# Patient Record
Sex: Female | Born: 1960 | Race: White | Hispanic: No | Marital: Married | Smoking: Never smoker
Health system: Southern US, Community
[De-identification: ages and names within clinical notes are randomized; demographics above are authoritative.]

## PROBLEM LIST (undated history)

## (undated) HISTORY — PX: BUNIONECTOMY: SHX129

---

## 2014-01-11 LAB — HM COLONOSCOPY

## 2017-12-10 ENCOUNTER — Encounter: Payer: Self-pay | Admitting: Family Medicine

## 2017-12-10 ENCOUNTER — Ambulatory Visit: Payer: Federal, State, Local not specified - PPO | Admitting: Family Medicine

## 2017-12-10 ENCOUNTER — Encounter (INDEPENDENT_AMBULATORY_CARE_PROVIDER_SITE_OTHER): Payer: Self-pay

## 2017-12-10 ENCOUNTER — Other Ambulatory Visit (HOSPITAL_COMMUNITY)
Admission: RE | Admit: 2017-12-10 | Discharge: 2017-12-10 | Disposition: A | Discharge status: Home / Self Care | Payer: Federal, State, Local not specified - PPO | Source: Ambulatory Visit | Attending: Family Medicine | Admitting: Family Medicine

## 2017-12-10 VITALS — BP 120/70 | Resp 14 | Ht 63.0 in | Wt 172.8 lb

## 2017-12-10 DIAGNOSIS — Z1231 Encounter for screening mammogram for malignant neoplasm of breast: Secondary | ICD-10-CM | POA: Diagnosis not present

## 2017-12-10 DIAGNOSIS — N3941 Urge incontinence: Secondary | ICD-10-CM | POA: Diagnosis not present

## 2017-12-10 DIAGNOSIS — Z0000 Encounter for general adult medical examination without abnormal findings: Secondary | ICD-10-CM | POA: Diagnosis not present

## 2017-12-10 DIAGNOSIS — Z7689 Persons encountering health services in other specified circumstances: Secondary | ICD-10-CM

## 2017-12-10 DIAGNOSIS — Z23 Encounter for immunization: Secondary | ICD-10-CM

## 2017-12-10 DIAGNOSIS — Z13820 Encounter for screening for osteoporosis: Secondary | ICD-10-CM

## 2017-12-10 DIAGNOSIS — M1711 Unilateral primary osteoarthritis, right knee: Secondary | ICD-10-CM

## 2017-12-10 DIAGNOSIS — E66811 Obesity, class 1: Secondary | ICD-10-CM

## 2017-12-10 DIAGNOSIS — Z6830 Body mass index (BMI) 30.0-30.9, adult: Secondary | ICD-10-CM

## 2017-12-10 DIAGNOSIS — Z1159 Encounter for screening for other viral diseases: Secondary | ICD-10-CM | POA: Diagnosis not present

## 2017-12-10 DIAGNOSIS — E6609 Other obesity due to excess calories: Secondary | ICD-10-CM | POA: Diagnosis not present

## 2017-12-10 DIAGNOSIS — Z124 Encounter for screening for malignant neoplasm of cervix: Secondary | ICD-10-CM

## 2017-12-10 DIAGNOSIS — R21 Rash and other nonspecific skin eruption: Secondary | ICD-10-CM | POA: Diagnosis not present

## 2017-12-10 DIAGNOSIS — Z13220 Encounter for screening for lipoid disorders: Secondary | ICD-10-CM

## 2017-12-10 NOTE — Progress Notes (Signed)
Name: Carrie Jackson   MRN: [id number]    DOB: May 11, 1960   Date:12/10/2017       Progress Note  Subjective  Chief Complaint  Chief Complaint  Patient presents with  . Establish Care  . Rash    bilateral hands recurrent blisters since July    HPI  Patient presents for annual CPE, bilateral hand rash, and establish care.  She moved to [place] from [place] about a year ago with her husband and is in need of a PCP.  Hand rash - blistering rash to dorsal side of hands since July; she notices it worsens with heat exposure.  She was given triamcinolone cream by provider in [place] and this did not seem to help.  She takes estroven and noticed that they changed the formulation/look of the pill around the same time that the rash started.  She does not want to stop Black Cohosh completely, will try a plain [employer].  She says she is very moody if not on the estroven.  Discussed Effexor as another alternative if plain black cohosh does not work.  Diet: She eats fairly well balanced and healthy - heavy in fruits and vegetables. Exercise: Exercises regularly.   USPSTF grade A and B recommendations    Office Visit from 12/10/2017 in [facility]  AUDIT-C Score  3    Splits a bottle of wine a couple of nights a week.  Depression:  Depression screen [facility] 2/9 12/10/2017  Decreased Interest 0  Down, Depressed, Hopeless 0  PHQ - 2 Score 0  Altered sleeping 0  Tired, decreased energy 0  Change in appetite 0  Feeling bad or failure about yourself  0  Trouble concentrating 0  Moving slowly or fidgety/restless 0  Suicidal thoughts 0  PHQ-9 Score 0  Difficult doing work/chores Not difficult at all   Hypertension: BP Readings from Last 3 Encounters:  12/10/17 120/70   Obesity: Wt Readings from Last 3 Encounters:  12/10/17 172 lb 12.8 oz (78.4 kg)   BMI Readings from Last 3 Encounters:  12/10/17 30.61 kg/m  Goal is to be 163lbs.   Hep C Screening: We will  check today STD testing and prevention (HIV/chl/gon/syphilis): Declines Intimate partner violence: No concerns Sexual History/Pain during Intercourse: No pain or concerns Menstrual History/LMP/Abnormal Bleeding: LMP was >6 years ago; no vaginal bleeding Incontinence Symptoms: Does have occasional incontinence symptoms.  Advanced Care Planning: A voluntary discussion about advance care planning including the explanation and discussion of advance directives.  Discussed health care proxy and Living will, and the patient was able to identify a health care proxy as Carrie Jackson.  Patient does have a living will at present time. If patient does have living will, I have requested they bring this to the clinic to be scanned in to their chart.  Breast cancer: Due for this today No results found for: [facility]  BRCA gene screening: Not indicated Cervical cancer screening: She is due today  Osteoporosis Screening: Has never had.  No results found for: HMDEXASCAN  Lipids: We will check today  Glucose: We will check today No results found for: GLUCOSE, GLUCAP  Skin cancer: No concerning lesions; wears sunscreen Colorectal cancer: Dad had colorectal cancer died at 63yo.  Has had colonoscopy 2 years ago and told to f/u in 10 years.   ECG: Denies CP, ShOB, or palpitations; not indicated at this time.  Patient Active Problem List   Diagnosis Date Noted  . Arthritis of right knee 12/10/2017  .  Urge incontinence of urine 12/10/2017   Past Surgical History:  Procedure Laterality Date  . BUNIONECTOMY      Family History  Problem Relation Age of Onset  . Hypothyroidism Mother   . Cancer Father 43       Colon Cancer, passed at age 8    Social History   Socioeconomic History  . Marital status: Married    Spouse name: Aaron Edelman  . Number of children: 2  . Years of education: Not on file  . Highest education level: Not on file  Occupational History  . Occupation: part-time seasonal     Comment: [employer]  Social Needs  . Financial resource strain: Not hard at all  . Food insecurity:    Worry: Never true    Inability: Never true  . Transportation needs:    Medical: No    Non-medical: No  Tobacco Use  . Smoking status: Never Smoker  . Smokeless tobacco: Never Used  Substance and Sexual Activity  . Alcohol use: Yes    Comment: occasional  . Drug use: Never  . Sexual activity: Yes  Lifestyle  . Physical activity:    Days per week: 6 days    Minutes per session: 60 min  . Stress: Not at all  Relationships  . Social connections:    Talks on phone: More than three times a week    Gets together: More than three times a week    Attends religious service: More than 4 times per year    Active member of club or organization: Yes    Attends meetings of clubs or organizations: 1 to 4 times per year    Relationship status: Married  . Intimate partner violence:    Fear of current or ex partner: No    Emotionally abused: No    Physically abused: No    Forced sexual activity: No  Other Topics Concern  . Not on file  Social History Narrative  . Not on file     Current Outpatient Medications:  .  glucosamine-chondroitin 500-400 MG tablet, Take 1 tablet by mouth daily., Disp: , Rfl:  .  Misc Natural Products (BLACK COHOSH MENOPAUSE COMPLEX) TABS, Take 40 mg by mouth daily., Disp: , Rfl:  .  naproxen sodium (ALEVE) 220 MG tablet, Take 220 mg by mouth 2 (two) times daily as needed., Disp: , Rfl:   No Known Allergies   ROS  Constitutional: Negative for fever or weight change.  Respiratory: Negative for cough and shortness of breath.   Cardiovascular: Negative for chest pain or palpitations.  Gastrointestinal: Negative for abdominal pain, no bowel changes.  Musculoskeletal: Negative for gait problem or joint swelling.  Skin: Negative for rash.  Neurological: Negative for dizziness or headache.  No other specific complaints in a complete review of systems (except as  listed in HPI above).  Objective  Vitals:   12/10/17 0836  BP: 120/70  Resp: 14  Weight: 172 lb 12.8 oz (78.4 kg)  Height: _0  (1.6 m)    Body mass index is 30.61 kg/m.  Physical Exam Constitutional: Patient appears well-developed and well-nourished. No distress.  HENT: Head: Normocephalic and atraumatic. Ears: B TMs ok, no erythema or effusion; Nose: Nose normal. Mouth/Throat: Oropharynx is clear and moist. No oropharyngeal exudate.  Eyes: Conjunctivae and EOM are normal. Pupils are equal, round, and reactive to light. No scleral icterus.  Neck: Normal range of motion. Neck supple. No JVD present. No thyromegaly present.  Cardiovascular: Normal rate,  regular rhythm and normal heart sounds.  No murmur heard. No BLE edema. Pulmonary/Chest: Effort normal and breath sounds normal. No respiratory distress. Abdominal: Soft. Bowel sounds are normal, no distension. There is no tenderness. no masses Breast: no lumps or masses, no nipple discharge or rashes FEMALE GENITALIA:  External genitalia normal  External urethra normal Vaginal vault normal without discharge or lesions Cervix normal without discharge or lesions Bimanual exam normal without masses Musculoskeletal: Normal range of motion, no joint effusions. No gross deformities Neurological: he is alert and oriented to person, place, and time. No cranial nerve deficit. Coordination, balance, strength, speech and gait are normal.  Skin: Skin is warm and dry.  No erythema.  Multiple scattered small blistering lesions on the dorsal aspect of bilateral hands in various stages of healing.  Non-tender, no apparent secondary infection. Psychiatric: Patient has a normal mood and affect. behavior is normal. Judgment and thought content normal.  No results found for this or any previous visit (from the past 2160 hour(s)).  PHQ2/9: Depression screen PHQ 2/9 12/10/2017  Decreased Interest 0  Down, Depressed, Hopeless 0  PHQ - 2 Score 0   Altered sleeping 0  Tired, decreased energy 0  Change in appetite 0  Feeling bad or failure about yourself  0  Trouble concentrating 0  Moving slowly or fidgety/restless 0  Suicidal thoughts 0  PHQ-9 Score 0  Difficult doing work/chores Not difficult at all   Fall Risk: Fall Risk  12/10/2017  Falls in the past year? No   Assessment & Plan  1. Well woman exam (no gynecological exam) -USPSTF grade A and B recommendations reviewed with patient; age-appropriate recommendations, preventive care, screening tests, etc discussed and encouraged; healthy living encouraged; see AVS for patient education given to patient -Discussed importance of 150 minutes of physical activity weekly, eat two servings of fish weekly, eat one serving of tree nuts ( cashews, pistachios, pecans, almonds.Marland Kitchen) every other day, eat 6 servings of fruit/vegetables daily and drink plenty of water and avoid sweet beverages.  - Hepatitis C antibody - MM 3D SCREEN BREAST BILATERAL; Future - Cytology - PAP - Lipid panel - COMPLETE METABOLIC PANEL WITH GFR - TSH - Flu Vaccine QUAD 6+ mos PF IM (Fluarix Quad PF) - Tdap vaccine greater than or equal to 7yo IM  2. Arthritis of right knee - May take Aleve PRN  3. Encounter to establish care  4. Urge incontinence of urine Discussed kegel exercises in detail; see AVS  5. Cervical cancer screening - Cytology - PAP  6. Need for hepatitis C screening test - Hepatitis C antibody  7. Breast cancer screening by mammogram - MM 3D SCREEN BREAST BILATERAL; Future  8. Class 1 obesity due to excess calories without serious comorbidity with body mass index (BMI) of 30.0 to 30.9 in adult - Lipid panel - COMPLETE METABOLIC PANEL WITH GFR - TSH - See above regarding health teaching; goal weight is 163lbs for patient - she is working on being more active and healthy diet.  9. Lipid screening - Lipid panel  10. Needs flu shot - Flu Vaccine QUAD 6+ mos PF IM (Fluarix Quad  PF)  11. Need for Tdap vaccination - Tdap vaccine greater than or equal to 7yo IM  12. Rash of hands - Discussed in detail with the patient - the only changes she can determine is a change in the manufacturing of her Estravin (black cohosh with soy).  She will stop this and start taking plain black  cohosh to see if this helps her symptoms.  I advised she may need to come off black cohosh completely and start Effexor or other alternative to help with her post-menopausal moods/hot flashes.  She verbalizes understanding.  She will monitor skin for signs and symptoms of secondary infection.  13. Osteoporosis screening - DG Bone Density; Future

## 2017-12-10 NOTE — Patient Instructions (Signed)
Kegel Exercises Kegel exercises help strengthen the muscles that support the rectum, vagina, small intestine, bladder, and uterus. Doing Kegel exercises can help:  Improve bladder and bowel control.  Improve sexual response.  Reduce problems and discomfort during pregnancy.  Kegel exercises involve squeezing your pelvic floor muscles, which are the same muscles you squeeze when you try to stop the flow of urine. The exercises can be done while sitting, standing, or lying down, but it is best to vary your position. Phase 1 exercises 1. Squeeze your pelvic floor muscles tight. You should feel a tight lift in your rectal area. If you are a female, you should also feel a tightness in your vaginal area. Keep your stomach, buttocks, and legs relaxed. 2. Hold the muscles tight for up to 10 seconds. 3. Relax your muscles. Repeat this exercise 50 times a day or as many times as told by your health care provider. Continue to do this exercise for at least 4-6 weeks or for as long as told by your health care provider. This information is not intended to replace advice given to you by your health care provider. Make sure you discuss any questions you have with your health care provider. Document Released: 02/12/2012 Document Revised: 10/21/2015 Document Reviewed: 01/15/2015 Elsevier Interactive Patient Education  2018 [place] Years, Female Preventive care refers to lifestyle choices and visits with your health care provider that can promote health and wellness. What does preventive care include?  A yearly physical exam. This is also called an annual well check.  Dental exams once or twice a year.  Routine eye exams. Ask your health care provider how often you should have your eyes checked.  Personal lifestyle choices, including: ? Daily care of your teeth and gums. ? Regular physical activity. ? Eating a healthy diet. ? Avoiding tobacco and drug use. ? Limiting  alcohol use. ? Practicing safe sex. ? Taking low-dose aspirin daily starting at age 56. ? Taking vitamin and mineral supplements as recommended by your health care provider. What happens during an annual well check? The services and screenings done by your health care provider during your annual well check will depend on your age, overall health, lifestyle risk factors, and family history of disease. Counseling Your health care provider may ask you questions about your:  Alcohol use.  Tobacco use.  Drug use.  Emotional well-being.  Home and relationship well-being.  Sexual activity.  Eating habits.  Work and work Statistician.  Method of birth control.  Menstrual cycle.  Pregnancy history.  Screening You may have the following tests or measurements:  Height, weight, and BMI.  Blood pressure.  Lipid and cholesterol levels. These may be checked every 5 years, or more frequently if you are over 62 years old.  Skin check.  Lung cancer screening. You may have this screening every year starting at age 37 if you have a 30-pack-year history of smoking and currently smoke or have quit within the past 15 years.  Fecal occult blood test (FOBT) of the stool. You may have this test every year starting at age 76.  Flexible sigmoidoscopy or colonoscopy. You may have a sigmoidoscopy every 5 years or a colonoscopy every 10 years starting at age 34.  Hepatitis C blood test.  Hepatitis B blood test.  Sexually transmitted disease (STD) testing.  Diabetes screening. This is done by checking your blood sugar (glucose) after you have not eaten for a while (fasting). You may have this  done every 1-3 years.  Mammogram. This may be done every 1-2 years. Talk to your health care provider about when you should start having regular mammograms. This may depend on whether you have a family history of breast cancer.  BRCA-related cancer screening. This may be done if you have a family  history of breast, ovarian, tubal, or peritoneal cancers.  Pelvic exam and Pap test. This may be done every 3 years starting at age 2. Starting at age 75, this may be done every 5 years if you have a Pap test in combination with an HPV test.  Bone density scan. This is done to screen for osteoporosis. You may have this scan if you are at high risk for osteoporosis.  Discuss your test results, treatment options, and if necessary, the need for more tests with your health care provider. Vaccines Your health care provider may recommend certain vaccines, such as:  Influenza vaccine. This is recommended every year.  Tetanus, diphtheria, and acellular pertussis (Tdap, Td) vaccine. You may need a Td booster every 10 years.  Varicella vaccine. You may need this if you have not been vaccinated.  Zoster vaccine. You may need this after age 30.  Measles, mumps, and rubella (MMR) vaccine. You may need at least one dose of MMR if you were born in 1957 or later. You may also need a second dose.  Pneumococcal 13-valent conjugate (PCV13) vaccine. You may need this if you have certain conditions and were not previously vaccinated.  Pneumococcal polysaccharide (PPSV23) vaccine. You may need one or two doses if you smoke cigarettes or if you have certain conditions.  Meningococcal vaccine. You may need this if you have certain conditions.  Hepatitis A vaccine. You may need this if you have certain conditions or if you travel or work in places where you may be exposed to hepatitis A.  Hepatitis B vaccine. You may need this if you have certain conditions or if you travel or work in places where you may be exposed to hepatitis B.  Haemophilus influenzae type b (Hib) vaccine. You may need this if you have certain conditions.  Talk to your health care provider about which screenings and vaccines you need and how often you need them. This information is not intended to replace advice given to you by your  health care provider. Make sure you discuss any questions you have with your health care provider. Document Released: 03/24/2015 Document Revised: 11/15/2015 Document Reviewed: 12/27/2014 Elsevier Interactive Patient Education  [employer].

## 2017-12-11 LAB — COMPLETE METABOLIC PANEL WITH GFR
AG Ratio: 2.3 (calc) (ref 1.0–2.5)
ALBUMIN MSPROF: 4.8 g/dL (ref 3.6–5.1)
ALT: 16 U/L (ref 6–29)
AST: 19 U/L (ref 10–35)
Alkaline phosphatase (APISO): 68 U/L (ref 33–130)
BILIRUBIN TOTAL: 0.4 mg/dL (ref 0.2–1.2)
BUN: 17 mg/dL (ref 7–25)
CO2: 29 mmol/L (ref 20–32)
Calcium: 9.8 mg/dL (ref 8.6–10.4)
Chloride: 102 mmol/L (ref 98–110)
Creat: 0.77 mg/dL (ref 0.50–1.05)
GFR, EST AFRICAN AMERICAN: 99 mL/min/{1.73_m2} (ref >=60)
GFR, EST NON AFRICAN AMERICAN: 86 mL/min/{1.73_m2} (ref >=60)
GLUCOSE: 81 mg/dL (ref 65–139)
Globulin: 2.1 g/dL (calc) (ref 1.9–3.7)
Potassium: 4.1 mmol/L (ref 3.5–5.3)
Sodium: 139 mmol/L (ref 135–146)
TOTAL PROTEIN: 6.9 g/dL (ref 6.1–8.1)

## 2017-12-11 LAB — LIPID PANEL
Cholesterol: 212 mg/dL — ABNORMAL HIGH (ref <200)
HDL: 54 mg/dL (ref >50)
LDL Cholesterol (Calc): 139 mg/dL (calc) — ABNORMAL HIGH
Non-HDL Cholesterol (Calc): 158 mg/dL (calc) — ABNORMAL HIGH (ref <130)
TRIGLYCERIDES: 90 mg/dL (ref <150)
Total CHOL/HDL Ratio: 3.9 (calc) (ref <5.0)

## 2017-12-11 LAB — HEPATITIS C ANTIBODY
Hepatitis C Ab: NONREACTIVE
SIGNAL TO CUT-OFF: 0.03 (ref <1.00)

## 2017-12-11 LAB — TSH: TSH: 3.12 mIU/L (ref 0.40–4.50)

## 2017-12-15 LAB — CYTOLOGY - PAP: HPV: NOT DETECTED

## 2018-02-12 ENCOUNTER — Ambulatory Visit
Admission: RE | Admit: 2018-02-12 | Discharge: 2018-02-12 | Disposition: A | Discharge status: Home / Self Care | Payer: Federal, State, Local not specified - PPO | Source: Ambulatory Visit | Attending: Family Medicine | Admitting: Family Medicine

## 2018-02-12 DIAGNOSIS — Z0000 Encounter for general adult medical examination without abnormal findings: Secondary | ICD-10-CM | POA: Diagnosis present

## 2018-02-12 DIAGNOSIS — Z1231 Encounter for screening mammogram for malignant neoplasm of breast: Secondary | ICD-10-CM | POA: Insufficient documentation

## 2018-02-12 DIAGNOSIS — Z13820 Encounter for screening for osteoporosis: Secondary | ICD-10-CM | POA: Diagnosis present

## 2018-02-17 LAB — HM MAMMOGRAPHY

## 2018-02-18 ENCOUNTER — Encounter: Payer: Self-pay | Admitting: Emergency Medicine

## 2018-12-14 ENCOUNTER — Encounter: Payer: Federal, State, Local not specified - PPO | Admitting: Family Medicine

## 2019-01-18 ENCOUNTER — Ambulatory Visit (INDEPENDENT_AMBULATORY_CARE_PROVIDER_SITE_OTHER): Payer: Federal, State, Local not specified - PPO | Admitting: Family Medicine

## 2019-01-18 ENCOUNTER — Other Ambulatory Visit (HOSPITAL_COMMUNITY)
Admission: RE | Admit: 2019-01-18 | Discharge: 2019-01-18 | Disposition: A | Discharge status: Home / Self Care | Payer: Federal, State, Local not specified - PPO | Source: Ambulatory Visit | Attending: Family Medicine | Admitting: Family Medicine

## 2019-01-18 ENCOUNTER — Other Ambulatory Visit: Payer: Self-pay

## 2019-01-18 ENCOUNTER — Encounter: Payer: Self-pay | Admitting: Family Medicine

## 2019-01-18 VITALS — BP 110/70 | HR 60 | Temp 97.5°F | Resp 16 | Ht 63.0 in | Wt 170.6 lb

## 2019-01-18 DIAGNOSIS — Z124 Encounter for screening for malignant neoplasm of cervix: Secondary | ICD-10-CM

## 2019-01-18 DIAGNOSIS — Z1231 Encounter for screening mammogram for malignant neoplasm of breast: Secondary | ICD-10-CM | POA: Diagnosis not present

## 2019-01-18 DIAGNOSIS — Z131 Encounter for screening for diabetes mellitus: Secondary | ICD-10-CM

## 2019-01-18 DIAGNOSIS — E7800 Pure hypercholesterolemia, unspecified: Secondary | ICD-10-CM

## 2019-01-18 DIAGNOSIS — Z01419 Encounter for gynecological examination (general) (routine) without abnormal findings: Secondary | ICD-10-CM | POA: Diagnosis not present

## 2019-01-18 DIAGNOSIS — Z23 Encounter for immunization: Secondary | ICD-10-CM

## 2019-01-18 LAB — COMPLETE METABOLIC PANEL WITH GFR
AG Ratio: 2 (calc) (ref 1.0–2.5)
ALT: 14 U/L (ref 6–29)
AST: 17 U/L (ref 10–35)
Albumin: 4.6 g/dL (ref 3.6–5.1)
Alkaline phosphatase (APISO): 76 U/L (ref 37–153)
BUN: 16 mg/dL (ref 7–25)
CO2: 30 mmol/L (ref 20–32)
Calcium: 9.5 mg/dL (ref 8.6–10.4)
Chloride: 104 mmol/L (ref 98–110)
Creat: 0.66 mg/dL (ref 0.50–1.05)
GFR, Est African American: 113 mL/min/{1.73_m2} (ref >=60)
GFR, Est Non African American: 97 mL/min/{1.73_m2} (ref >=60)
Globulin: 2.3 g/dL (calc) (ref 1.9–3.7)
Glucose, Bld: 78 mg/dL (ref 65–99)
Potassium: 4.1 mmol/L (ref 3.5–5.3)
Sodium: 142 mmol/L (ref 135–146)
Total Bilirubin: 0.5 mg/dL (ref 0.2–1.2)
Total Protein: 6.9 g/dL (ref 6.1–8.1)

## 2019-01-18 LAB — LIPID PANEL
Cholesterol: 240 mg/dL — ABNORMAL HIGH (ref <200)
HDL: 60 mg/dL (ref >=50)
LDL Cholesterol (Calc): 159 mg/dL (calc) — ABNORMAL HIGH
Non-HDL Cholesterol (Calc): 180 mg/dL (calc) — ABNORMAL HIGH (ref <130)
Total CHOL/HDL Ratio: 4 (calc) (ref <5.0)
Triglycerides: 97 mg/dL (ref <150)

## 2019-01-18 NOTE — Addendum Note (Signed)
Addended by: Gurbani Figge, Ulla Potash on: 01/18/2019 09:37 AM   Modules accepted: Orders

## 2019-01-18 NOTE — Progress Notes (Signed)
Name: Carrie Jackson   MRN: [id number]    DOB: 11/08/1960   Date:01/18/2019       Progress Note  Subjective  Chief Complaint  Chief Complaint  Patient presents with  . Annual Exam    HPI  Patient presents for annual CPE.  Diet: Has suffered a bit since her mother moved in, but she is moving out in a few weeks.  She otherwise has a fairly healthy diet. Exercise: She is walking daily; does strength training, playing some tennis, riding bikes  USPSTF grade A and B recommendations    Office Visit from 01/18/2019 in [facility]  AUDIT-C Score  1    3 bottles a week - discussed cutting back.  Depression: Phq 9 is  negative Depression screen [facility] 2/9 01/18/2019 12/10/2017  Decreased Interest 0 0  Down, Depressed, Hopeless 0 0  PHQ - 2 Score 0 0  Altered sleeping 0 0  Tired, decreased energy 0 0  Change in appetite 0 0  Feeling bad or failure about yourself  0 0  Trouble concentrating 0 0  Moving slowly or fidgety/restless 0 0  Suicidal thoughts 0 0  PHQ-9 Score 0 0  Difficult doing work/chores Not difficult at all Not difficult at all   Hypertension: BP Readings from Last 3 Encounters:  01/18/19 110/70  12/10/17 120/70   Obesity: Wt Readings from Last 3 Encounters:  01/18/19 170 lb 9.6 oz (77.4 kg)  12/10/17 172 lb 12.8 oz (78.4 kg)   BMI Readings from Last 3 Encounters:  01/18/19 30.22 kg/m  12/10/17 30.61 kg/m   Hep C Screening: Negative 2020 STD testing and prevention (HIV/chl/gon/syphilis): Declines screening; no new partners.  Intimate partner violence: No concerns Sexual History/Pain during Intercourse:  No concerns Menstrual History/LMP/Abnormal Bleeding: She is post-menopausal, no vaginal bleeding Incontinence Symptoms: Mild symptoms for urge incontinence - declines referral to PT or Urology.   Breast cancer:  - Last Mammogram: 02/2018 and negative - BRCA gene screening: No family history  Osteoporosis: discussed high calcium and  vitamin D supplementation; her DEXA in 2019 was normal, will repeat in 5 years as indicated.  Doing weight bearing exercises regularly.   Cervical cancer screening: Unsatisfactory last year, did not come in for repeat.  She is low risk - no prior history of abnormal, same partner for many years.  We will repeat today.  She is NOT s/p hysterectomy; she is s/p menopause.  Skin cancer: discussed atypical lesion monitoring; she does see dermatologist every 6 months.  Colorectal cancer: Believes this was done at age 57 and was told to follow up in 10 years.  Lung cancer: Never smoker.  Low Dose CT Chest recommended if Age 33-80 years, 30 pack-year currently smoking OR have quit w/in 15years. Patient does not qualify.   ECG: Denies chest pain, shortness of breath, or palpitations  Advanced Care Planning: A voluntary discussion about advance care planning including the explanation and discussion of advance directives.  Discussed health care proxy and Living will, and the patient was able to identify a health care proxy as Carrie Jackson (Husband) as primary.  Patient does have a living will at present time. If patient does have living will, I have requested they bring this to the clinic to be scanned in to their chart.  Lipids: Lab Results  Component Value Date   CHOL 212 (H) 12/10/2017   Lab Results  Component Value Date   HDL 54 12/10/2017   Lab Results  Component Value  Date   LDLCALC 139 (H) 12/10/2017   Lab Results  Component Value Date   TRIG 90 12/10/2017   Lab Results  Component Value Date   CHOLHDL 3.9 12/10/2017   No results found for: LDLDIRECT  Glucose: Glucose, Bld  Date Value Ref Range Status  12/10/2017 81 65 - 139 mg/dL Final    Comment:    .        Non-fasting reference interval .     Patient Active Problem List   Diagnosis Date Noted  . Arthritis of right knee 12/10/2017  . Urge incontinence of urine 12/10/2017    Past Surgical History:  Procedure  Laterality Date  . BUNIONECTOMY      Family History  Problem Relation Age of Onset  . Hypothyroidism Mother   . Cancer Father 38       Colon Cancer, passed at age 46    Social History   Socioeconomic History  . Marital status: Married    Spouse name: Aaron Edelman  . Number of children: 2  . Years of education: Not on file  . Highest education level: Not on file  Occupational History  . Occupation: part-time seasonal    Comment: [employer]  Social Needs  . Financial resource strain: Not hard at all  . Food insecurity    Worry: Never true    Inability: Never true  . Transportation needs    Medical: No    Non-medical: No  Tobacco Use  . Smoking status: Never Smoker  . Smokeless tobacco: Never Used  Substance and Sexual Activity  . Alcohol use: Yes    Comment: occasional  . Drug use: Never  . Sexual activity: Yes    Partners: Male  Lifestyle  . Physical activity    Days per week: 6 days    Minutes per session: 60 min  . Stress: Not at all  Relationships  . Social connections    Talks on phone: More than three times a week    Gets together: Never    Attends religious service: More than 4 times per year    Active member of club or organization: Yes    Attends meetings of clubs or organizations: 1 to 4 times per year    Relationship status: Married  . Intimate partner violence    Fear of current or ex partner: No    Emotionally abused: No    Physically abused: No    Forced sexual activity: No  Other Topics Concern  . Not on file  Social History Narrative  . Not on file     Current Outpatient Medications:  .  glucosamine-chondroitin 500-400 MG tablet, Take 1 tablet by mouth daily., Disp: , Rfl:  .  Misc Natural Products (BLACK COHOSH MENOPAUSE COMPLEX) TABS, Take 40 mg by mouth daily., Disp: , Rfl:  .  naproxen sodium (ALEVE) 220 MG tablet, Take 220 mg by mouth 2 (two) times daily as needed., Disp: , Rfl:   No Known Allergies   ROS  Constitutional: Negative for  fever or weight change.  Respiratory: Negative for cough and shortness of breath.   Cardiovascular: Negative for chest pain or palpitations.  Gastrointestinal: Negative for abdominal pain, no bowel changes.  Musculoskeletal: Negative for gait problem or joint swelling.  Skin: Negative for rash.  Neurological: Negative for dizziness or headache.  No other specific complaints in a complete review of systems (except as listed in HPI above).  Objective  Vitals:   01/18/19 0844  BP:  110/70  Pulse: 60  Resp: 16  Temp: (!) 97.5 F (36.4 C)  TempSrc: Temporal  SpO2: 97%  Weight: 170 lb 9.6 oz (77.4 kg)  Height: _0  (1.6 m)    Body mass index is 30.22 kg/m.  Physical Exam  Constitutional: Patient appears well-developed and well-nourished. No distress.  HENT: Head: Normocephalic and atraumatic. Ears: B TMs ok, no erythema or effusion; Nose: Nose normal. Mouth/Throat: Oropharynx is clear and moist. No oropharyngeal exudate.  Eyes: Conjunctivae and EOM are normal. Pupils are equal, round, and reactive to light. No scleral icterus.  Neck: Normal range of motion. Neck supple. No JVD present. No thyromegaly present.  Cardiovascular: Normal rate, regular rhythm and normal heart sounds.  No murmur heard. No BLE edema. Pulmonary/Chest: Effort normal and breath sounds normal. No respiratory distress. Abdominal: Soft. Bowel sounds are normal, no distension. There is no tenderness. no masses Breast: no lumps or masses, no nipple discharge or rashes FEMALE GENITALIA:  External genitalia normal External urethra normal Vaginal vault normal without discharge or lesions Cervix normal without discharge or lesions Bimanual exam normal without masses RECTAL: no rectal masses or hemorrhoids Musculoskeletal: Normal range of motion, no joint effusions. No gross deformities Neurological: he is alert and oriented to person, place, and time. No cranial nerve deficit. Coordination, balance, strength,  speech and gait are normal.  Skin: Skin is warm and dry. No rash noted. No erythema.  Psychiatric: Patient has a normal mood and affect. behavior is normal. Judgment and thought content normal.  No results found for this or any previous visit (from the past 2160 hour(s)).  Fall Risk: Fall Risk  01/18/2019 12/10/2017  Falls in the past year? 0 No  Number falls in past yr: 0 -  Injury with Fall? 0 -  Follow up Falls evaluation completed -   Assessment & Plan  1. Well woman exam -USPSTF grade A and B recommendations reviewed with patient; age-appropriate recommendations, preventive care, screening tests, etc discussed and encouraged; healthy living encouraged; see AVS for patient education given to patient -Discussed importance of 150 minutes of physical activity weekly, eat two servings of fish weekly, eat one serving of tree nuts ( cashews, pistachios, pecans, almonds.Marland Kitchen) every other day, eat 6 servings of fruit/vegetables daily and drink plenty of water and avoid sweet beverages.  - MM 3D SCREEN BREAST BILATERAL; Future - Cytology - PAP - Lipid panel - COMPLETE METABOLIC PANEL WITH GFR  2. Encounter for screening mammogram for malignant neoplasm of breast - MM 3D SCREEN BREAST BILATERAL; Future  3. Cervical cancer screening - Cytology - PAP  4. Elevated LDL cholesterol level - Lipid panel  5. Diabetes mellitus screening - COMPLETE METABOLIC PANEL WITH GFR

## 2019-01-19 LAB — CYTOLOGY - PAP: Diagnosis: NEGATIVE

## 2019-04-20 ENCOUNTER — Ambulatory Visit
Admission: RE | Admit: 2019-04-20 | Discharge: 2019-04-20 | Disposition: A | Discharge status: Home / Self Care | Payer: Federal, State, Local not specified - PPO | Source: Ambulatory Visit | Attending: Family Medicine | Admitting: Family Medicine

## 2019-04-20 DIAGNOSIS — Z01419 Encounter for gynecological examination (general) (routine) without abnormal findings: Secondary | ICD-10-CM | POA: Diagnosis not present

## 2019-04-20 DIAGNOSIS — Z1231 Encounter for screening mammogram for malignant neoplasm of breast: Secondary | ICD-10-CM | POA: Insufficient documentation

## 2019-06-07 ENCOUNTER — Other Ambulatory Visit: Payer: Self-pay

## 2019-06-07 ENCOUNTER — Ambulatory Visit: Payer: Federal, State, Local not specified - PPO | Attending: Internal Medicine

## 2019-06-07 DIAGNOSIS — Z23 Encounter for immunization: Secondary | ICD-10-CM

## 2019-06-07 NOTE — Progress Notes (Signed)
   Covid-19 Vaccination Clinic  Name:  Carrie Jackson    MRN: [id number] DOB: 03/18/1960  06/07/2019  [name] was observed post Covid-19 immunization for 15 minutes without incident. She was provided with Vaccine Information Sheet and instruction to access the V-Safe system.   [name] was instructed to call 911 with any severe reactions post vaccine: Marland Kitchen Difficulty breathing  . Swelling of face and throat  . A fast heartbeat  . A bad rash all over body  . Dizziness and weakness   Immunizations Administered    Name Date Dose VIS Date Route   Pfizer COVID-19 Vaccine 06/07/2019  3:42 PM 0.3 mL 02/19/2019 Intramuscular   Manufacturer: [employer], [employer]   Lot: [id number]   NDC: [address]-6

## 2019-06-28 ENCOUNTER — Ambulatory Visit: Payer: Federal, State, Local not specified - PPO | Attending: Internal Medicine

## 2019-06-28 DIAGNOSIS — Z23 Encounter for immunization: Secondary | ICD-10-CM

## 2019-06-28 NOTE — Progress Notes (Signed)
   Covid-19 Vaccination Clinic  Name:  Carrie Jackson    MRN: [id number] DOB: 08/12/1960  06/28/2019  Carrie Jackson was observed post Covid-19 immunization for 15 minutes without incident. She was provided with Vaccine Information Sheet and instruction to access the V-Safe system.   Carrie Jackson was instructed to call 911 with any severe reactions post vaccine: Marland Kitchen Difficulty breathing  . Swelling of face and throat  . A fast heartbeat  . A bad rash all over body  . Dizziness and weakness   Immunizations Administered    Name Date Dose VIS Date Route   Pfizer COVID-19 Vaccine 06/28/2019  3:19 PM 0.3 mL 05/05/2018 Intramuscular   Manufacturer: [employer], [employer]   Lot: [id number]   NDC: [address]-8

## 2020-01-20 ENCOUNTER — Ambulatory Visit: Payer: Federal, State, Local not specified - PPO | Admitting: Family Medicine

## 2020-01-20 ENCOUNTER — Encounter: Payer: Federal, State, Local not specified - PPO | Admitting: Family Medicine

## 2020-01-20 ENCOUNTER — Encounter: Payer: Self-pay | Admitting: Family Medicine

## 2020-01-20 ENCOUNTER — Other Ambulatory Visit: Payer: Self-pay

## 2020-01-20 VITALS — BP 122/70 | HR 86 | Temp 98.5°F | Resp 18 | Ht 63.0 in | Wt 172.1 lb

## 2020-01-20 DIAGNOSIS — E782 Mixed hyperlipidemia: Secondary | ICD-10-CM

## 2020-01-20 DIAGNOSIS — E669 Obesity, unspecified: Secondary | ICD-10-CM

## 2020-01-20 DIAGNOSIS — Z8349 Family history of other endocrine, nutritional and metabolic diseases: Secondary | ICD-10-CM

## 2020-01-20 DIAGNOSIS — Z23 Encounter for immunization: Secondary | ICD-10-CM | POA: Diagnosis not present

## 2020-01-20 DIAGNOSIS — Z0000 Encounter for general adult medical examination without abnormal findings: Secondary | ICD-10-CM | POA: Diagnosis not present

## 2020-01-20 DIAGNOSIS — Z1231 Encounter for screening mammogram for malignant neoplasm of breast: Secondary | ICD-10-CM

## 2020-01-20 DIAGNOSIS — Z6830 Body mass index (BMI) 30.0-30.9, adult: Secondary | ICD-10-CM

## 2020-01-20 NOTE — Patient Instructions (Addendum)
Health Maintenance  Topic Date Due  . Colon Cancer Screening  Never done  . HIV Screening  01/19/2021*  . Mammogram  04/19/2020  . Pap Smear  01/17/2022  . Pap Smear  01/17/2022  . Tetanus Vaccine  12/11/2027  . Flu Shot  Completed  . COVID-19 Vaccine  Completed  .  Hepatitis C: One time screening is recommended by Center for Disease Control  (CDC) for  adults born from [birth year] through 1965.   Completed  *Topic was postponed. The date shown is not the original due date.   [facility] at [place],  [place]  [id number] Get Driving Directions Main: [phone]      Preventive Care 57-86 Years Old, Female Preventive care refers to visits with your health care provider and lifestyle choices that can promote health and wellness. This includes:  A yearly physical exam. This may also be called an annual well check.  Regular dental visits and eye exams.  Immunizations.  Screening for certain conditions.  Healthy lifestyle choices, such as eating a healthy diet, getting regular exercise, not using drugs or products that contain nicotine and tobacco, and limiting alcohol use. What can I expect for my preventive care visit? Physical exam Your health care provider will check your:  Height and weight. This may be used to calculate body mass index (BMI), which tells if you are at a healthy weight.  Heart rate and blood pressure.  Skin for abnormal spots. Counseling Your health care provider may ask you questions about your:  Alcohol, tobacco, and drug use.  Emotional well-being.  Home and relationship well-being.  Sexual activity.  Eating habits.  Work and work Statistician.  Method of birth control.  Menstrual cycle.  Pregnancy history. What immunizations do I need?  Influenza (flu) vaccine  This is recommended every year. Tetanus, diphtheria, and pertussis (Tdap) vaccine  You may need a Td booster every 10  years. Varicella (chickenpox) vaccine  You may need this if you have not been vaccinated. Zoster (shingles) vaccine  You may need this after age 64. Measles, mumps, and rubella (MMR) vaccine  You may need at least one dose of MMR if you were born in 1957 or later. You may also need a second dose. Pneumococcal conjugate (PCV13) vaccine  You may need this if you have certain conditions and were not previously vaccinated. Pneumococcal polysaccharide (PPSV23) vaccine  You may need one or two doses if you smoke cigarettes or if you have certain conditions. Meningococcal conjugate (MenACWY) vaccine  You may need this if you have certain conditions. Hepatitis A vaccine  You may need this if you have certain conditions or if you travel or work in places where you may be exposed to hepatitis A. Hepatitis B vaccine  You may need this if you have certain conditions or if you travel or work in places where you may be exposed to hepatitis B. Haemophilus influenzae type b (Hib) vaccine  You may need this if you have certain conditions. Human papillomavirus (HPV) vaccine  If recommended by your health care provider, you may need three doses over 6 months. You may receive vaccines as individual doses or as more than one vaccine together in one shot (combination vaccines). Talk with your health care provider about the risks and benefits of combination vaccines. What tests do I need? Blood tests  Lipid and cholesterol levels. These may be checked every 5 years, or more frequently if you are  over 50 years old.  Hepatitis C test.  Hepatitis B test. Screening  Lung cancer screening. You may have this screening every year starting at age 102 if you have a 30-pack-year history of smoking and currently smoke or have quit within the past 15 years.  Colorectal cancer screening. All adults should have this screening starting at age 32 and continuing until age 77. Your health care provider may  recommend screening at age 19 if you are at increased risk. You will have tests every 1-10 years, depending on your results and the type of screening test.  Diabetes screening. This is done by checking your blood sugar (glucose) after you have not eaten for a while (fasting). You may have this done every 1-3 years.  Mammogram. This may be done every 1-2 years. Talk with your health care provider about when you should start having regular mammograms. This may depend on whether you have a family history of breast cancer.  BRCA-related cancer screening. This may be done if you have a family history of breast, ovarian, tubal, or peritoneal cancers.  Pelvic exam and Pap test. This may be done every 3 years starting at age 81. Starting at age 56, this may be done every 5 years if you have a Pap test in combination with an HPV test. Other tests  Sexually transmitted disease (STD) testing.  Bone density scan. This is done to screen for osteoporosis. You may have this scan if you are at high risk for osteoporosis. Follow these instructions at home: Eating and drinking  Eat a diet that includes fresh fruits and vegetables, whole grains, lean protein, and low-fat dairy.  Take vitamin and mineral supplements as recommended by your health care provider.  Do not drink alcohol if: ? Your health care provider tells you not to drink. ? You are pregnant, may be pregnant, or are planning to become pregnant.  If you drink alcohol: ? Limit how much you have to 0-1 drink a day. ? Be aware of how much alcohol is in your drink. In the [place] one drink equals one 12 oz bottle of beer (355 mL), one 5 oz glass of wine (148 mL), or one 1 oz glass of hard liquor (44 mL). Lifestyle  Take daily care of your teeth and gums.  Stay active. Exercise for at least 30 minutes on 5 or more days each week.  Do not use any products that contain nicotine or tobacco, such as cigarettes, e-cigarettes, and chewing tobacco. If  you need help quitting, ask your health care provider.  If you are sexually active, practice safe sex. Use a condom or other form of birth control (contraception) in order to prevent pregnancy and STIs (sexually transmitted infections).  If told by your health care provider, take low-dose aspirin daily starting at age 85. What's next?  Visit your health care provider once a year for a well check visit.  Ask your health care provider how often you should have your eyes and teeth checked.  Stay up to date on all vaccines. This information is not intended to replace advice given to you by your health care provider. Make sure you discuss any questions you have with your health care provider. Document Revised: 11/06/2017 Document Reviewed: 11/06/2017 Elsevier Patient Education  2020 [place].   Preventing Osteoporosis, Adult Osteoporosis is a condition that causes the bones to lose density. This means that the bones become thinner, and the normal spaces in bone tissue become larger. Low bone  density can make the bones weak and cause them to break more easily. Osteoporosis cannot always be prevented, but you can take steps to lower your risk of developing this condition. How can this condition affect me? If you develop osteoporosis, you will be more likely to break bones in your wrist, spine, or hip. Even a minor accident or injury can be enough to break weak bones. The bones will also be slower to heal. Osteoporosis can cause other problems as well, such as a stooped posture or trouble with movement. Osteoporosis can occur with aging. As you get older, you may lose bone tissue more quickly, or it may be replaced more slowly. Osteoporosis is more likely to develop if you have poor nutrition or do not get enough calcium or vitamin D. Other lifestyle factors can also play a role. By eating a well-balanced diet and making lifestyle changes, you can help keep your bones strong and healthy, lowering  your chances of developing osteoporosis. What can increase my risk? The following factors may make you more likely to develop osteoporosis:  Having a family history of the condition.  Having poor nutrition or not getting enough calcium or vitamin D.  Using certain medicines, such as steroid medicines or antiseizure medicines.  Being any of the following: ? 74 years of age or older. ? Female. ? A woman who has gone through menopause (is postmenopausal). ? White (Caucasian) or of Asian descent.  Smoking or having a history of smoking.  Not being physically active (being sedentary).  Having a small body frame. What actions can I take to prevent this?  Get enough calcium   Make sure you get enough calcium every day. Calcium is the most important mineral for bone health. Most people can get enough calcium from their diet, but supplements may be recommended for people who are at risk for osteoporosis. Follow these guidelines: ? If you are age 46 or younger, aim to get 1,000 mg of calcium every day. ? If you are older than age 5, aim to get 1,200 mg of calcium every day.  Good sources of calcium include: ? Dairy products, such as low-fat or nonfat milk, cheese, and yogurt. ? Dark green leafy vegetables, such as bok choy and broccoli. ? Foods that have had calcium added to them (calcium-fortified foods), such as orange juice, cereal, bread, soy beverages, and tofu products. ? Nuts, such as almonds.  Check nutrition labels to see how much calcium is in a food or drink. Get enough vitamin D  Try to get enough vitamin D every day. Vitamin D is the most essential vitamin for bone health. It helps the body absorb calcium. Follow these guidelines for how much vitamin D to get from food: ? If you are age 25 or younger, aim to get at least 600 international units (IU) every day. Your health care provider may suggest more. ? If you are older than age 22, aim to get at least 800 international  units every day. Your health care provider may suggest more.  Good sources of vitamin D in your diet include: ? Egg yolks. ? Oily fish, such as salmon, sardines, and tuna. ? Milk and cereal fortified with vitamin D.  Your body also makes vitamin D when you are out in the sun. Exposing the bare skin on your face, arms, legs, or back to the sun for no more than 30 minutes a day, 2 times a week is more than enough. Beyond that, make sure  you use sunblock to protect your skin from sunburn, which increases your risk for skin cancer. Exercise  Stay active and get exercise every day.  Ask your health care provider what types of exercise are best for you. Weight-bearing and strength-building activities are important for building and maintaining healthy bones. Some examples of these types of activities include: ? Walking and hiking. ? Jogging and running. ? Dancing. ? Gym exercises. ? Lifting weights. ? Tennis and racquetball. ? Climbing stairs. ? Aerobics. Make other lifestyle changes  Do not use any products that contain nicotine or tobacco, such as cigarettes, e-cigarettes, and chewing tobacco. If you need help quitting, ask your health care provider.  Lose weight if you are overweight.  If you drink alcohol: ? Limit how much you use to:  0-1 drink a day for nonpregnant women.  0-2 drinks a day for men. ? Be aware of how much alcohol is in your drink. In the [place] one drink equals one 12 oz bottle of beer (355 mL), one 5 oz glass of wine (148 mL), or one 1 oz glass of hard liquor (44 mL). Where to find support If you need help making changes to prevent osteoporosis, talk with your health care provider. You can ask for a referral to a diet and nutrition specialist (dietitian) and a physical therapist. Where to find more information Learn more about osteoporosis from:  NIH Osteoporosis and Related [place]: www.bones.SouthExposed.es  [place] Office on [employer]  Health: [place]s.tn  [place]: EquipmentWeekly.com.ee Summary  Osteoporosis is a condition that causes weak bones that are more likely to break.  Eat a healthy diet, making sure you get enough calcium and vitamin D, and stay active by getting regular exercise to help prevent osteoporosis.  Other ways to reduce your risk of osteoporosis include maintaining a healthy weight and avoiding alcohol and products that contain nicotine or tobacco. This information is not intended to replace advice given to you by your health care provider. Make sure you discuss any questions you have with your health care provider. Document Revised: 09/25/2018 Document Reviewed: 09/25/2018 Elsevier Patient Education  [place].

## 2020-01-20 NOTE — Progress Notes (Signed)
Patient: Carrie Jackson, Female    DOB: 06-30-1960, 59 y.o.   MRN: [id number] Hubbard Hartshorn, FNP Visit Date: 01/20/2020  Today's Provider: Delsa Grana, PA-C   Chief Complaint  Patient presents with  . Annual Exam   Subjective:   Annual physical exam:  Carrie Jackson is a 59 y.o. female who presents today for complete physical exam:  Exercise/Activity: very active, exercises at least 5 d a week 60+ min Diet/nutrition:  Has not been working on any particular diet or nutrition  HLD:  Last lipids and ASCVD risk reviewed, pt did not work on diet per recommendations after lab results last year, no family hx of early MI or stroke, pt denies  CP, SOB, exertional sx, LE edema, palpitation, Ha's, visual disturbances, lightheadedness, hypotension, syncope, claudication  Fx hx CRC in father at age 59 got colonoscopy - trying to confirm date - normal with 10 year f/up recommended - she thinks around 7 years ago  Menopause 5 years ago - no on any supplements, dexa scan 2019 was normal  Updated meds and allergie  USPSTF grade A and B recommendations - reviewed and addressed today  Depression:  Phq 9 completed today by patient, was reviewed by me with patient in the room PHQ score is neg, pt feels good PHQ 2/9 Scores 01/20/2020 01/18/2019 12/10/2017  PHQ - 2 Score 0 0 0  PHQ- 9 Score - 0 0   Depression screen [facility] 2/9 01/20/2020 01/18/2019 12/10/2017  Decreased Interest 0 0 0  Down, Depressed, Hopeless 0 0 0  PHQ - 2 Score 0 0 0  Altered sleeping - 0 0  Tired, decreased energy - 0 0  Change in appetite - 0 0  Feeling bad or failure about yourself  - 0 0  Trouble concentrating - 0 0  Moving slowly or fidgety/restless - 0 0  Suicidal thoughts - 0 0  PHQ-9 Score - 0 0  Difficult doing work/chores - Not difficult at all Not difficult at all    Alcohol screening:   [facility] Visit from 01/20/2020 in [facility]  AUDIT-C Score 1      Immunizations and  Health Maintenance: Health Maintenance  Topic Date Due  . HIV Screening  Never done  . COLONOSCOPY  Never done  . MAMMOGRAM  04/19/2020  . PAP-Cervical Cytology Screening  01/17/2022  . PAP SMEAR-Modifier  01/17/2022  . TETANUS/TDAP  12/11/2027  . INFLUENZA VACCINE  Completed  . COVID-19 Vaccine  Completed  . Hepatitis C Screening  Completed     Hep C Screening: done  STD testing and prevention (HIV/chl/gon/syphilis):  see above, no additional testing desired by pt today- HIV declined  Intimate partner violence:  None, denies  Sexual History/Pain during Intercourse: Married, no complaints or concerns  Menstrual History/LMP/Abnormal Bleeding: no AUB No LMP recorded. Patient is postmenopausal.  Incontinence Symptoms: no change to mild urge incontinence   Breast cancer:  Last Mammogram: *see HM list above BRCA gene screening:  None known  Cervical cancer screening:   Done last year 2023 Pt denies family hx of cancers - breast, ovarian, uterine Father colon CA in 80's  Osteoporosis:    Discussion on osteoporosis per age, including high calcium and vitamin D supplementation, weight bearing exercises Pt is not supplementing with daily calcium/Vit D.  Skin cancer:  Hx of skin CA -  NO - [place] skin for surveillance and some benign lesions removed Discussed atypical lesions   Colorectal cancer:  Colonoscopy is UTD - due in the next 3 years - requesting records, none previously received after establishing care here Discussed concerning signs and sx of CRC, pt denies   Lung cancer:   Low Dose CT Chest recommended if Age 7-80 years, 30 pack-year currently smoking OR have quit w/in 15years. Patient does not qualify.    Social History   Tobacco Use  . Smoking status: Never Smoker  . Smokeless tobacco: Never Used  Vaping Use  . Vaping Use: Never used  Substance Use Topics  . Alcohol use: Yes    Comment: occasional  . Drug use: Never       [facility] Visit from  01/20/2020 in [facility]  AUDIT-C Score 1      Family History  Problem Relation Age of Onset  . Hypothyroidism Mother   . Cancer Father 35       Colon Cancer, passed at age 10  . Breast cancer Neg Hx      Blood pressure/Hypertension: BP Readings from Last 3 Encounters:  01/20/20 122/70  01/18/19 110/70  12/10/17 120/70    Weight/Obesity: Wt Readings from Last 3 Encounters:  01/20/20 172 lb 1.6 oz (78.1 kg)  01/18/19 170 lb 9.6 oz (77.4 kg)  12/10/17 172 lb 12.8 oz (78.4 kg)   BMI Readings from Last 3 Encounters:  01/20/20 30.49 kg/m  01/18/19 30.22 kg/m  12/10/17 30.61 kg/m     Lipids:  Lab Results  Component Value Date   CHOL 240 (H) 01/18/2019   CHOL 212 (H) 12/10/2017   Lab Results  Component Value Date   HDL 60 01/18/2019   HDL 54 12/10/2017   Lab Results  Component Value Date   LDLCALC 159 (H) 01/18/2019   LDLCALC 139 (H) 12/10/2017   Lab Results  Component Value Date   TRIG 97 01/18/2019   TRIG 90 12/10/2017   Lab Results  Component Value Date   CHOLHDL 4.0 01/18/2019   CHOLHDL 3.9 12/10/2017   No results found for: LDLDIRECT Based on the results of lipid panel his/her cardiovascular risk factor ( using [place] )  in the next 10 years is: The 10-year ASCVD risk score Mikey Bussing DC Brooke Bonito., et al., 2013) is: 3%   Values used to calculate the score:     Age: 65 years     Sex: Female     Is Non-Hispanic African American: No     Diabetic: No     Tobacco smoker: No     Systolic Blood Pressure: 026 mmHg     Is BP treated: No     HDL Cholesterol: 60 mg/dL     Total Cholesterol: 240 mg/dL Glucose:  Glucose, Bld  Date Value Ref Range Status  01/18/2019 78 65 - 99 mg/dL Final    Comment:    .            Fasting reference interval .   12/10/2017 81 65 - 139 mg/dL Final    Comment:    .        Non-fasting reference interval .    Hypertension: BP Readings from Last 3 Encounters:  01/20/20 122/70  01/18/19 110/70    12/10/17 120/70   Obesity: Wt Readings from Last 3 Encounters:  01/20/20 172 lb 1.6 oz (78.1 kg)  01/18/19 170 lb 9.6 oz (77.4 kg)  12/10/17 172 lb 12.8 oz (78.4 kg)   BMI Readings from Last 3 Encounters:  01/20/20 30.49 kg/m  01/18/19 30.22 kg/m  12/10/17 30.61 kg/m      Advanced Care Planning:  A voluntary discussion about advance care planning including the explanation and discussion of advance directives.   Discussed health care proxy and Living will, and the patient was able to identify a health care proxy as Carrie Jackson Patient does have a living will at present time.   Social History      She        Social History   Socioeconomic History  . Marital status: Married    Spouse name: Aaron Edelman  . Number of children: 2  . Years of education: Not on file  . Highest education level: Not on file  Occupational History  . Occupation: part-time seasonal    Comment: [employer]  Tobacco Use  . Smoking status: Never Smoker  . Smokeless tobacco: Never Used  Vaping Use  . Vaping Use: Never used  Substance and Sexual Activity  . Alcohol use: Yes    Comment: occasional  . Drug use: Never  . Sexual activity: Yes    Partners: Male  Other Topics Concern  . Not on file  Social History Narrative  . Not on file   Social Determinants of Health   Financial Resource Strain: Low Risk   . Difficulty of Paying Living Expenses: Not hard at all  Food Insecurity: No Food Insecurity  . Worried About Charity fundraiser in the Last Year: Never true  . Ran Out of Food in the Last Year: Never true  Transportation Needs: No Transportation Needs  . Lack of Transportation (Medical): No  . Lack of Transportation (Non-Medical): No  Physical Activity: Sufficiently Active  . Days of Exercise per Week: 5 days  . Minutes of Exercise per Session: 60 min  Stress: No Stress Concern Present  . Feeling of Stress : Only a little  Social Connections: Socially Integrated  . Frequency of  Communication with Friends and Family: More than three times a week  . Frequency of Social Gatherings with Friends and Family: Twice a week  . Attends Religious Services: 1 to 4 times per year  . Active Member of Clubs or Organizations: Yes  . Attends Archivist Meetings: More than 4 times per year  . Marital Status: Married    Family History        Family History  Problem Relation Age of Onset  . Hypothyroidism Mother   . Cancer Father 53       Colon Cancer, passed at age 30  . Breast cancer Neg Hx     Patient Active Problem List   Diagnosis Date Noted  . Arthritis of right knee 12/10/2017  . Urge incontinence of urine 12/10/2017    Past Surgical History:  Procedure Laterality Date  . BUNIONECTOMY      No current outpatient medications on file.  No Known Allergies  Patient Care Team: Hubbard Hartshorn, FNP as PCP - General (Family Medicine)  Review of Systems  10 Systems reviewed and are negative for acute change except as noted in the HPI.   I personally reviewed active problem list, medication list, allergies, family history, social history, health maintenance, notes from last encounter, lab results, imaging with the patient/caregiver today.        Objective:   Vitals:  Vitals:   01/20/20 0953  BP: 122/70  Pulse: 86  Resp: 18  Temp: 98.5 F (36.9 C)  TempSrc: Oral  SpO2: 99%  Weight: 172 lb 1.6 oz (78.1 kg)  Height:  '5\' 3"'  (1.6 m)    Body mass index is 30.49 kg/m.  Physical Exam Vitals and nursing note reviewed.  Constitutional:      General: She is not in acute distress.    Appearance: Normal appearance. She is well-developed, well-groomed and overweight. She is not ill-appearing, toxic-appearing or diaphoretic.     Interventions: Face mask in place.  HENT:     Head: Normocephalic and atraumatic.     Right Ear: External ear normal.     Left Ear: External ear normal.  Eyes:     General: Lids are normal. No scleral icterus.        Right eye: No discharge.        Left eye: No discharge.     Conjunctiva/sclera: Conjunctivae normal.  Neck:     Trachea: Phonation normal. No tracheal deviation.  Cardiovascular:     Rate and Rhythm: Normal rate and regular rhythm.     Pulses: Normal pulses.          Radial pulses are 2+ on the right side and 2+ on the left side.       Posterior tibial pulses are 2+ on the right side and 2+ on the left side.     Heart sounds: Normal heart sounds. No murmur heard.  No friction rub. No gallop.   Pulmonary:     Effort: Pulmonary effort is normal. No respiratory distress.     Breath sounds: Normal breath sounds. No stridor. No wheezing, rhonchi or rales.  Chest:     Chest wall: No tenderness.  Abdominal:     General: Bowel sounds are normal. There is no distension.     Palpations: Abdomen is soft.     Tenderness: There is no right CVA tenderness or left CVA tenderness.  Musculoskeletal:     Right lower leg: No edema.     Left lower leg: No edema.  Skin:    General: Skin is warm and dry.     Coloration: Skin is not jaundiced or pale.     Findings: No rash.  Neurological:     Mental Status: She is alert.     Motor: No abnormal muscle tone.     Gait: Gait normal.  Psychiatric:        Mood and Affect: Mood normal.        Speech: Speech normal.        Behavior: Behavior normal. Behavior is cooperative.       Fall Risk: Fall Risk  01/20/2020 01/18/2019 12/10/2017  Falls in the past year? 0 0 No  Number falls in past yr: 0 0 -  Injury with Fall? 0 0 -  Follow up Falls evaluation completed Falls evaluation completed -    Functional Status Survey: Is the patient deaf or have difficulty hearing?: No Does the patient have difficulty seeing, even when wearing glasses/contacts?: No Does the patient have difficulty concentrating, remembering, or making decisions?: No Does the patient have difficulty walking or climbing stairs?: No Does the patient have difficulty dressing or  bathing?: No Does the patient have difficulty doing errands alone such as visiting a doctor's office or shopping?: No   Assessment & Plan:    CPE completed today  . USPSTF grade A and B recommendations reviewed with patient; age-appropriate recommendations, preventive care, screening tests, etc discussed and encouraged; healthy living encouraged; see AVS for patient education given to patient  . Discussed importance of 150 minutes of physical activity weekly, AHA exercise recommendations  given to pt in AVS/handout  . Discussed importance of healthy diet:  eating lean meats and proteins, avoiding trans fats and saturated fats, avoid simple sugars and excessive carbs in diet, eat 6 servings of fruit/vegetables daily and drink plenty of water and avoid sweet beverages.    . Recommended pt to do annual eye exam and routine dental exams/cleanings  . Depression, alcohol, fall screening completed as documented above and per flowsheets  . Reviewed Health Maintenance: Health Maintenance  Topic Date Due  . COLONOSCOPY  Never done  . HIV Screening  01/19/2021 (Originally 04/17/1975)  . MAMMOGRAM  04/19/2020  . PAP-Cervical Cytology Screening  01/17/2022  . PAP SMEAR-Modifier  01/17/2022  . TETANUS/TDAP  12/11/2027  . INFLUENZA VACCINE  Completed  . COVID-19 Vaccine  Completed  . Hepatitis C Screening  Completed    . Immunizations: Immunization History  Administered Date(s) Administered  . Influenza,inj,Quad PF,6+ Mos 12/10/2017, 01/18/2019, 01/20/2020  . PFIZER SARS-COV-2 Vaccination 06/07/2019, 06/28/2019  . Tdap 12/10/2017     ICD-10-CM   1. Adult general medical exam  Z00.00 CBC with Differential/Platelet    COMPLETE METABOLIC PANEL WITH GFR    Lipid panel    TSH  2. Need for influenza vaccination  Z23 Flu Vaccine QUAD 6+ mos PF IM (Fluarix Quad PF)  3. Mixed hyperlipidemia  E15.8 COMPLETE METABOLIC PANEL WITH GFR    Lipid panel   reviewed past lipids, discussed ascvd risk  calculator, pt to work on diet/lifestyle changed, may repeat labs in ~4 months, discussed statin meds  4. Class 1 obesity with body mass index (BMI) of 30.0 to 30.9 in adult, unspecified obesity type, unspecified whether serious comorbidity present  X09.4 COMPLETE METABOLIC PANEL WITH GFR   Z68.30 Lipid panel  5. Encounter for screening mammogram for malignant neoplasm of breast  Z12.31 MM 3D SCREEN BREAST BILATERAL   due next feb  6. Family history of thyroid disease in mother  Z67.49 TSH   Pt works part time in Scientist, research (medical) for fun, active, exercises a lot. Moved here from [place] about 2.5 years ago - has Chief Executive Officer and advanced directives from [place], still needs to change over to [facility] documentation She ate breakfast Discussed lipids, guidelines, ASCVD risk/statins - will send info and ASCVD to mychart - with info regarding statin recommendations.  Possibly repeat lipids in 4-6 months after pt works more on diet/lifestyle changes  Need colonoscopy records - if we cannot get would refer for consult in the next 1-2 years    Delsa Grana, Hershal Coria 01/20/20 10:18 AM  [place]

## 2020-01-21 LAB — CBC WITH DIFFERENTIAL/PLATELET
Absolute Monocytes: 340 cells/uL (ref 200–950)
Basophils Absolute: 18 cells/uL (ref 0–200)
Basophils Relative: 0.4 %
Eosinophils Absolute: 60 cells/uL (ref 15–500)
Eosinophils Relative: 1.3 %
HCT: 39.3 % (ref 35.0–45.0)
Hemoglobin: 13.1 g/dL (ref 11.7–15.5)
Lymphs Abs: 1228 cells/uL (ref 850–3900)
MCH: 30.5 pg (ref 27.0–33.0)
MCHC: 33.3 g/dL (ref 32.0–36.0)
MCV: 91.6 fL (ref 80.0–100.0)
MPV: 9.7 fL (ref 7.5–12.5)
Monocytes Relative: 7.4 %
Neutro Abs: 2953 cells/uL (ref 1500–7800)
Neutrophils Relative %: 64.2 %
Platelets: 237 10*3/uL (ref 140–400)
RBC: 4.29 10*6/uL (ref 3.80–5.10)
RDW: 12.6 % (ref 11.0–15.0)
Total Lymphocyte: 26.7 %
WBC: 4.6 10*3/uL (ref 3.8–10.8)

## 2020-01-21 LAB — LIPID PANEL
Cholesterol: 225 mg/dL — ABNORMAL HIGH (ref <200)
HDL: 65 mg/dL (ref >=50)
LDL Cholesterol (Calc): 141 mg/dL (calc) — ABNORMAL HIGH
Non-HDL Cholesterol (Calc): 160 mg/dL (calc) — ABNORMAL HIGH (ref <130)
Total CHOL/HDL Ratio: 3.5 (calc) (ref <5.0)
Triglycerides: 84 mg/dL (ref <150)

## 2020-01-21 LAB — COMPLETE METABOLIC PANEL WITH GFR
AG Ratio: 2 (calc) (ref 1.0–2.5)
ALT: 18 U/L (ref 6–29)
AST: 19 U/L (ref 10–35)
Albumin: 4.9 g/dL (ref 3.6–5.1)
Alkaline phosphatase (APISO): 81 U/L (ref 37–153)
BUN: 16 mg/dL (ref 7–25)
CO2: 31 mmol/L (ref 20–32)
Calcium: 10.1 mg/dL (ref 8.6–10.4)
Chloride: 104 mmol/L (ref 98–110)
Creat: 0.7 mg/dL (ref 0.50–1.05)
GFR, Est African American: 110 mL/min/{1.73_m2} (ref >=60)
GFR, Est Non African American: 95 mL/min/{1.73_m2} (ref >=60)
Globulin: 2.5 g/dL (calc) (ref 1.9–3.7)
Glucose, Bld: 98 mg/dL (ref 65–99)
Potassium: 4.5 mmol/L (ref 3.5–5.3)
Sodium: 142 mmol/L (ref 135–146)
Total Bilirubin: 0.5 mg/dL (ref 0.2–1.2)
Total Protein: 7.4 g/dL (ref 6.1–8.1)

## 2020-01-21 LAB — TSH: TSH: 2.47 mIU/L (ref 0.40–4.50)

## 2020-01-21 NOTE — Addendum Note (Signed)
Addended by: Danelle Berry on: 01/21/2020 04:33 PM   Modules accepted: Orders

## 2020-01-25 ENCOUNTER — Encounter: Payer: Self-pay | Admitting: Family Medicine

## 2020-03-13 ENCOUNTER — Telehealth (INDEPENDENT_AMBULATORY_CARE_PROVIDER_SITE_OTHER): Payer: Federal, State, Local not specified - PPO | Admitting: Family Medicine

## 2020-03-13 ENCOUNTER — Encounter: Payer: Self-pay | Admitting: Family Medicine

## 2020-03-13 ENCOUNTER — Other Ambulatory Visit: Payer: Self-pay

## 2020-03-13 DIAGNOSIS — R059 Cough, unspecified: Secondary | ICD-10-CM | POA: Diagnosis not present

## 2020-03-13 DIAGNOSIS — R509 Fever, unspecified: Secondary | ICD-10-CM | POA: Diagnosis not present

## 2020-03-13 DIAGNOSIS — R5383 Other fatigue: Secondary | ICD-10-CM | POA: Diagnosis not present

## 2020-03-13 MED ORDER — BENZONATATE 100 MG PO CAPS: 100.0000 mg | ORAL_CAPSULE | Freq: Two times a day (BID) | ORAL | 0 refills | Status: DC | PRN

## 2020-03-13 NOTE — Progress Notes (Signed)
Name: Carrie Jackson   MRN: [id number]    DOB: 07-Sep-1960   Date:03/13/2020       Progress Note  Subjective  Chief Complaint  Fever Cough Congestion  I connected with  Carrie Jackson  on 03/13/20 at 11:00 AM EST by a video enabled telemedicine application and verified that I am speaking with the correct person using two identifiers.  I discussed the limitations of evaluation and management by telemedicine and the availability of in person appointments. The patient expressed understanding and agreed to proceed with the virtual visit  Staff also discussed with the patient that there may be a patient responsible charge related to this service. Patient Location: at home  Provider Location: [facility] Additional Individuals present: none   HPI  She states symptoms started 4 days ago , she developed a fever 101, fatigue and a dry cough, at night she feels a rattling in her chest. She had all 3 COVID-19 vaccines. She is not a smoker and denies exposure to COVID-19. She has noticed mild nausea and mild lack of appetite, but able to stay hydrated.  She denies SOB or wheezing a this time   Patient Active Problem List   Diagnosis Date Noted  . Arthritis of right knee 12/10/2017  . Urge incontinence of urine 12/10/2017    Past Surgical History:  Procedure Laterality Date  . BUNIONECTOMY      Family History  Problem Relation Age of Onset  . Hypothyroidism Mother   . Cancer Father 70       Colon Cancer, passed at age 32  . Breast cancer Neg Hx     Social History   Socioeconomic History  . Marital status: Married    Spouse name: [name]  . Number of children: 2  . Years of education: Not on file  . Highest education level: Not on file  Occupational History  . Occupation: part-time seasonal    Comment: [employer]  Tobacco Use  . Smoking status: Never Smoker  . Smokeless tobacco: Never Used  Vaping Use  . Vaping Use: Never used  Substance and Sexual Activity  . Alcohol use: Yes     Comment: occasional  . Drug use: Never  . Sexual activity: Yes    Partners: Male  Other Topics Concern  . Not on file  Social History Narrative  . Not on file   Social Determinants of Health   Financial Resource Strain: Low Risk   . Difficulty of Paying Living Expenses: Not hard at all  Food Insecurity: No Food Insecurity  . Worried About Programme researcher, broadcasting/film/video in the Last Year: Never true  . Ran Out of Food in the Last Year: Never true  Transportation Needs: No Transportation Needs  . Lack of Transportation (Medical): No  . Lack of Transportation (Non-Medical): No  Physical Activity: Sufficiently Active  . Days of Exercise per Week: 5 days  . Minutes of Exercise per Session: 60 min  Stress: No Stress Concern Present  . Feeling of Stress : Only a little  Social Connections: Socially Integrated  . Frequency of Communication with Friends and Family: More than three times a week  . Frequency of Social Gatherings with Friends and Family: Twice a week  . Attends Religious Services: 1 to 4 times per year  . Active Member of Clubs or Organizations: Yes  . Attends Banker Meetings: More than 4 times per year  . Marital Status: Married  Catering manager Violence: Not At Risk  .  Fear of Current or Ex-Partner: No  . Emotionally Abused: No  . Physically Abused: No  . Sexually Abused: No    No current outpatient medications on file.  Allergies  Allergen Reactions  . Naproxen Rash and Other (See Comments)    Rash and blisters to hands/body, no anaphylactic or airway involvement, she avoids all nsaids    I personally reviewed active problem list, medication list, allergies with the patient/caregiver today.   ROS  Ten systems reviewed and is negative except as mentioned in HPI   Objective  Virtual encounter, vitals not obtained.  There is no height or weight on file to calculate BMI.  Physical Exam  Awake, alert and oriented, no distress  PHQ2/9: Depression  screen [facility] 2/9 01/20/2020 01/18/2019 12/10/2017  Decreased Interest 0 0 0  Down, Depressed, Hopeless 0 0 0  PHQ - 2 Score 0 0 0  Altered sleeping - 0 0  Tired, decreased energy - 0 0  Change in appetite - 0 0  Feeling bad or failure about yourself  - 0 0  Trouble concentrating - 0 0  Moving slowly or fidgety/restless - 0 0  Suicidal thoughts - 0 0  PHQ-9 Score - 0 0  Difficult doing work/chores - Not difficult at all Not difficult at all   PHQ-2/9 Result is negative.    Fall Risk: Fall Risk  01/20/2020 01/18/2019 12/10/2017  Falls in the past year? 0 0 No  Number falls in past yr: 0 0 -  Injury with Fall? 0 0 -  Follow up Falls evaluation completed Falls evaluation completed -    Assessment & Plan  1. Cough  Explained that is may be COVID-19, flu or CAP. She needs to monitor pulse ox at home and seek medical care if below 92 %. We will treat cough, discussed isolation. She does not have risk factors to warrant referral for monoclonal antibodies.  - Novel Coronavirus, NAA ([facility]) - benzonatate (TESSALON) 100 MG capsule; Take 1-2 capsules (100-200 mg total) by mouth 2 (two) times daily as needed.  Dispense: 40 capsule; Refill: 0 - DG Chest 2 View; Future  2. Fever, unspecified fever cause  - Novel Coronavirus, NAA ([facility]) - benzonatate (TESSALON) 100 MG capsule; Take 1-2 capsules (100-200 mg total) by mouth 2 (two) times daily as needed.  Dispense: 40 capsule; Refill: 0 - DG Chest 2 View; Future  3. Fatigue, unspecified type  - Novel Coronavirus, NAA ([facility]) - benzonatate (TESSALON) 100 MG capsule; Take 1-2 capsules (100-200 mg total) by mouth 2 (two) times daily as needed.  Dispense: 40 capsule; Refill: 0 - DG Chest 2 View; Future  I discussed the assessment and treatment plan with the patient. The patient was provided an opportunity to ask questions and all were answered. The patient agreed with the plan and demonstrated an understanding of the instructions.  The  patient was advised to call back or seek an in-person evaluation if the symptoms worsen or if the condition fails to improve as anticipated.  I provided 15  minutes of non-face-to-face time during this encounter.

## 2020-03-16 ENCOUNTER — Other Ambulatory Visit: Payer: Self-pay | Admitting: Family Medicine

## 2020-03-16 DIAGNOSIS — R059 Cough, unspecified: Secondary | ICD-10-CM

## 2020-03-16 LAB — NOVEL CORONAVIRUS, NAA: SARS-CoV-2, NAA: NOT DETECTED

## 2020-03-16 LAB — SARS-COV-2, NAA 2 DAY TAT

## 2020-03-16 MED ORDER — AZITHROMYCIN 250 MG PO TABS: ORAL_TABLET | ORAL | 0 refills | Status: DC

## 2020-04-25 ENCOUNTER — Ambulatory Visit
Admission: RE | Admit: 2020-04-25 | Discharge: 2020-04-25 | Disposition: A | Discharge status: Home / Self Care | Payer: Federal, State, Local not specified - PPO | Source: Ambulatory Visit | Attending: Family Medicine | Admitting: Family Medicine

## 2020-04-25 ENCOUNTER — Other Ambulatory Visit: Payer: Self-pay

## 2020-04-25 DIAGNOSIS — Z1231 Encounter for screening mammogram for malignant neoplasm of breast: Secondary | ICD-10-CM | POA: Diagnosis not present

## 2021-01-22 ENCOUNTER — Encounter: Payer: Federal, State, Local not specified - PPO | Admitting: Family Medicine

## 2021-02-13 ENCOUNTER — Encounter: Payer: Federal, State, Local not specified - PPO | Admitting: Family Medicine

## 2021-02-13 ENCOUNTER — Encounter: Payer: Self-pay | Admitting: Internal Medicine

## 2021-02-13 ENCOUNTER — Ambulatory Visit (INDEPENDENT_AMBULATORY_CARE_PROVIDER_SITE_OTHER): Payer: Federal, State, Local not specified - PPO | Admitting: Internal Medicine

## 2021-02-13 VITALS — BP 114/66 | HR 97 | Temp 97.6°F | Resp 16 | Ht 63.0 in | Wt 169.2 lb

## 2021-02-13 DIAGNOSIS — E782 Mixed hyperlipidemia: Secondary | ICD-10-CM | POA: Diagnosis not present

## 2021-02-13 DIAGNOSIS — Z0000 Encounter for general adult medical examination without abnormal findings: Secondary | ICD-10-CM | POA: Diagnosis not present

## 2021-02-13 DIAGNOSIS — Z1231 Encounter for screening mammogram for malignant neoplasm of breast: Secondary | ICD-10-CM

## 2021-02-13 LAB — COMPLETE METABOLIC PANEL WITH GFR
AG Ratio: 1.9 (calc) (ref 1.0–2.5)
ALT: 18 U/L (ref 6–29)
AST: 24 U/L (ref 10–35)
Albumin: 4.8 g/dL (ref 3.6–5.1)
Alkaline phosphatase (APISO): 82 U/L (ref 37–153)
BUN: 19 mg/dL (ref 7–25)
CO2: 29 mmol/L (ref 20–32)
Calcium: 10.1 mg/dL (ref 8.6–10.4)
Chloride: 101 mmol/L (ref 98–110)
Creat: 0.76 mg/dL (ref 0.50–1.05)
Globulin: 2.5 g/dL (calc) (ref 1.9–3.7)
Glucose, Bld: 94 mg/dL (ref 65–99)
Potassium: 4.3 mmol/L (ref 3.5–5.3)
Sodium: 138 mmol/L (ref 135–146)
Total Bilirubin: 0.5 mg/dL (ref 0.2–1.2)
Total Protein: 7.3 g/dL (ref 6.1–8.1)
eGFR: 90 mL/min/{1.73_m2} (ref >=60)

## 2021-02-13 LAB — CBC WITH DIFFERENTIAL/PLATELET
Absolute Monocytes: 431 cells/uL (ref 200–950)
Basophils Absolute: 28 cells/uL (ref 0–200)
Basophils Relative: 0.5 %
Eosinophils Absolute: 78 cells/uL (ref 15–500)
Eosinophils Relative: 1.4 %
HCT: 38.5 % (ref 35.0–45.0)
Hemoglobin: 12.7 g/dL (ref 11.7–15.5)
Lymphs Abs: 1523 cells/uL (ref 850–3900)
MCH: 29.2 pg (ref 27.0–33.0)
MCHC: 33 g/dL (ref 32.0–36.0)
MCV: 88.5 fL (ref 80.0–100.0)
MPV: 10.4 fL (ref 7.5–12.5)
Monocytes Relative: 7.7 %
Neutro Abs: 3539 cells/uL (ref 1500–7800)
Neutrophils Relative %: 63.2 %
Platelets: 239 10*3/uL (ref 140–400)
RBC: 4.35 10*6/uL (ref 3.80–5.10)
RDW: 12.9 % (ref 11.0–15.0)
Total Lymphocyte: 27.2 %
WBC: 5.6 10*3/uL (ref 3.8–10.8)

## 2021-02-13 LAB — LIPID PANEL
Cholesterol: 211 mg/dL — ABNORMAL HIGH (ref <200)
HDL: 53 mg/dL (ref >=50)
LDL Cholesterol (Calc): 141 mg/dL (calc) — ABNORMAL HIGH
Non-HDL Cholesterol (Calc): 158 mg/dL (calc) — ABNORMAL HIGH (ref <130)
Total CHOL/HDL Ratio: 4 (calc) (ref <5.0)
Triglycerides: 75 mg/dL (ref <150)

## 2021-02-13 NOTE — Progress Notes (Signed)
Name: Carrie Jackson   MRN: [id number]    DOB: 1960/05/09   Date:02/13/2021       Progress Note  Subjective  Chief Complaint  Chief Complaint  Patient presents with   Annual Exam    HPI  Patient presents for annual CPE. No chronic health conditions, takes no daily medication other than multivitamin, biotin, magensium/calcium supplements.  Diet: eats lean proteins, fiber/vegetables, likes cheese Exercise: walks 50-60 minutes a day, swimming, weight training    Flowsheet Row Office Visit from 01/20/2020 in [facility]  AUDIT-C Score 1      Depression: Phq 9 is  negative Depression screen [facility] 2/9 02/13/2021 03/13/2020 01/20/2020 01/18/2019 12/10/2017  Decreased Interest 0 0 0 0 0  Down, Depressed, Hopeless 0 0 0 0 0  PHQ - 2 Score 0 0 0 0 0  Altered sleeping 0 - - 0 0  Tired, decreased energy 0 - - 0 0  Change in appetite 0 - - 0 0  Feeling bad or failure about yourself  0 - - 0 0  Trouble concentrating 0 - - 0 0  Moving slowly or fidgety/restless 0 - - 0 0  Suicidal thoughts 0 - - 0 0  PHQ-9 Score 0 - - 0 0  Difficult doing work/chores Not difficult at all - - Not difficult at all Not difficult at all   Hypertension: BP Readings from Last 3 Encounters:  02/13/21 114/66  01/20/20 122/70  01/18/19 110/70   Obesity: Wt Readings from Last 3 Encounters:  02/13/21 169 lb 3.2 oz (76.7 kg)  01/20/20 172 lb 1.6 oz (78.1 kg)  01/18/19 170 lb 9.6 oz (77.4 kg)   BMI Readings from Last 3 Encounters:  02/13/21 29.97 kg/m  01/20/20 30.49 kg/m  01/18/19 30.22 kg/m     Vaccines:  HPV: up to at age 21 , ask insurance if age between 56-45  Shingrix: 5-64 yo and ask insurance if covered when patient above 3 yo Pneumonia: Educated and discussed with patient. Flu: up to date, educated and discussed with patient.  Hep C Screening: 2019, negative  STD testing and prevention (HIV/chl/gon/syphilis): No concerns Sexual History: sexually active with one  partner  Breast cancer:  - Last Mammogram: 04/2020, [facility] 1  Osteoporosis: Discussed high calcium and vitamin D supplementation, weight bearing exercises. DEXA normal in 2019  Cervical cancer screening: Last Pap 11/20, negative repeat next year  Skin cancer: Discussed monitoring for atypical lesions  Colorectal cancer: Colonoscopy in 2015, repeat in 10 years   Lung cancer:  Low Dose CT Chest recommended if Age 44-80 years, 20 pack-year currently smoking OR have quit w/in 15years. Patient does not qualify.    Advanced Care Planning: A voluntary discussion about advance care planning including the explanation and discussion of advance directives.  Discussed health care proxy and Living will, and the patient was able to identify a health care proxy as husband Alonnie Bieker.  Patient does have a living will at present time. If patient does have living will, I have requested they bring this to the clinic to be scanned in to their chart.   Lipids: Lab Results  Component Value Date   CHOL 225 (H) 01/20/2020   CHOL 240 (H) 01/18/2019   CHOL 212 (H) 12/10/2017   Lab Results  Component Value Date   HDL 65 01/20/2020   HDL 60 01/18/2019   HDL 54 12/10/2017   Lab Results  Component Value Date   LDLCALC 141 (H) 01/20/2020  LDLCALC 159 (H) 01/18/2019   LDLCALC 139 (H) 12/10/2017   Lab Results  Component Value Date   TRIG 84 01/20/2020   TRIG 97 01/18/2019   TRIG 90 12/10/2017   Lab Results  Component Value Date   CHOLHDL 3.5 01/20/2020   CHOLHDL 4.0 01/18/2019   CHOLHDL 3.9 12/10/2017   No results found for: LDLDIRECT  Glucose: Glucose, Bld  Date Value Ref Range Status  01/20/2020 98 65 - 99 mg/dL Final    Comment:    .            Fasting reference interval .   01/18/2019 78 65 - 99 mg/dL Final    Comment:    .            Fasting reference interval .   12/10/2017 81 65 - 139 mg/dL Final    Comment:    .        Non-fasting reference interval .     Patient  Active Problem List   Diagnosis Date Noted   Arthritis of right knee 12/10/2017   Urge incontinence of urine 12/10/2017    Past Surgical History:  Procedure Laterality Date   BUNIONECTOMY      Family History  Problem Relation Age of Onset   Hypothyroidism Mother    Cancer Father 89       Colon Cancer, passed at age 11   Breast cancer Neg Hx     Social History   Socioeconomic History   Marital status: Married    Spouse name: [name]   Number of children: 2   Years of education: Not on file   Highest education level: Not on file  Occupational History   Occupation: part-time seasonal    Comment: [employer]  Tobacco Use   Smoking status: Never   Smokeless tobacco: Never  Vaping Use   Vaping Use: Never used  Substance and Sexual Activity   Alcohol use: Yes    Comment: occasional   Drug use: Never   Sexual activity: Yes    Partners: Male  Other Topics Concern   Not on file  Social History Narrative   Not on file   Social Determinants of Health   Financial Resource Strain: Not on file  Food Insecurity: Not on file  Transportation Needs: Not on file  Physical Activity: Not on file  Stress: Not on file  Social Connections: Not on file  Intimate Partner Violence: Not on file     Current Outpatient Medications:    azithromycin (ZITHROMAX) 250 MG tablet, Take 2 first day and one daily after that, Disp: 6 tablet, Rfl: 0   benzonatate (TESSALON) 100 MG capsule, Take 1-2 capsules (100-200 mg total) by mouth 2 (two) times daily as needed., Disp: 40 capsule, Rfl: 0  Allergies  Allergen Reactions   Naproxen Rash and Other (See Comments)    Rash and blisters to hands/body, no anaphylactic or airway involvement, she avoids all nsaids     Review of Systems  Constitutional: Negative.   HENT: Negative.    Respiratory: Negative.    Cardiovascular: Negative.   Gastrointestinal: Negative.   Neurological: Negative.     Objective  Vitals:   02/13/21 1119  BP: 114/66   Pulse: 97  Resp: 16  Temp: 97.6 F (36.4 C)  SpO2: 99%  Weight: 169 lb 3.2 oz (76.7 kg)  Height: 5\' 3"  (1.6 m)    Body mass index is 29.97 kg/m.  Physical Exam Constitutional:  Appearance: Normal appearance.  HENT:     Head: Normocephalic and atraumatic.  Eyes:     Conjunctiva/sclera: Conjunctivae normal.  Cardiovascular:     Rate and Rhythm: Normal rate and regular rhythm.  Pulmonary:     Effort: Pulmonary effort is normal.     Breath sounds: Normal breath sounds.  Abdominal:     General: There is no distension.     Palpations: Abdomen is soft.     Tenderness: There is no abdominal tenderness.  Musculoskeletal:     Right lower leg: No edema.     Left lower leg: No edema.  Skin:    General: Skin is warm and dry.  Neurological:     General: No focal deficit present.     Mental Status: She is alert. Mental status is at baseline.  Psychiatric:        Mood and Affect: Mood normal.        Behavior: Behavior normal.    No results found for this or any previous visit (from the past 2160 hour(s)).   Fall Risk: Fall Risk  02/13/2021 03/13/2020 01/20/2020 01/18/2019 12/10/2017  Falls in the past year? 0 0 0 0 No  Number falls in past yr: 0 0 0 0 -  Injury with Fall? 0 0 0 0 -  Follow up - - Falls evaluation completed Falls evaluation completed -   Assessment & Plan  1. Adult general medical exam/Mixed hyperlipidemia: Reviewed cholesterol panel from last year, the patient has been watching diet. She does have a family history of HLD/CAD. Recheck lipid panel, CBC, CMP today. Follow up in 1 year or sooner as needed.   - Lipid panel - COMPLETE METABOLIC PANEL WITH GFR - CBC w/Diff/Platelet  2.  Encounter for screening mammogram for malignant neoplasm of breast: Mammogram for this year ordered.   - MM 3D SCREEN BREAST BILATERAL; Future   -USPSTF grade A and B recommendations reviewed with patient; age-appropriate recommendations, preventive care, screening tests, etc  discussed and encouraged; healthy living encouraged; see AVS for patient education given to patient -Discussed importance of 150 minutes of physical activity weekly, eat two servings of fish weekly, eat one serving of tree nuts ( cashews, pistachios, pecans, almonds.Marland Kitchen) every other day, eat 6 servings of fruit/vegetables daily and drink plenty of water and avoid sweet beverages.   -Reviewed Health Maintenance: yes

## 2021-02-13 NOTE — Patient Instructions (Addendum)
It was great seeing you today!  Plan discussed at today's visit: -Blood work ordered today, results will be uploaded to MyChart.  -Mammogram ordered today to be scheduled -Ask your pharmacy about Shingles vaccine   Follow up in: 1 year  Take care and let us know if you have any questions or concerns prior to your next visit.  Dr. Caralee Ates  Health Maintenance, Female Adopting a healthy lifestyle and getting preventive care are important in promoting health and wellness. Ask your health care provider about: The right schedule for you to have regular tests and exams. Things you can do on your own to prevent diseases and keep yourself healthy. What should I know about diet, weight, and exercise? Eat a healthy diet  Eat a diet that includes plenty of vegetables, fruits, low-fat dairy products, and lean protein. Do not eat a lot of foods that are high in solid fats, added sugars, or sodium. Maintain a healthy weight Body mass index (BMI) is used to identify weight problems. It estimates body fat based on height and weight. Your health care provider can help determine your BMI and help you achieve or maintain a healthy weight. Get regular exercise Get regular exercise. This is one of the most important things you can do for your health. Most adults should: Exercise for at least 150 minutes each week. The exercise should increase your heart rate and make you sweat (moderate-intensity exercise). Do strengthening exercises at least twice a week. This is in addition to the moderate-intensity exercise. Spend less time sitting. Even light physical activity can be beneficial. Watch cholesterol and blood lipids Have your blood tested for lipids and cholesterol at 60 years of age, then have this test every 5 years. Have your cholesterol levels checked more often if: Your lipid or cholesterol levels are high. You are older than 60 years of age. You are at high risk for heart disease. What should I  know about cancer screening? Depending on your health history and family history, you may need to have cancer screening at various ages. This may include screening for: Breast cancer. Cervical cancer. Colorectal cancer. Skin cancer. Lung cancer. What should I know about heart disease, diabetes, and high blood pressure? Blood pressure and heart disease High blood pressure causes heart disease and increases the risk of stroke. This is more likely to develop in people who have high blood pressure readings or are overweight. Have your blood pressure checked: Every 3-5 years if you are 37-68 years of age. Every year if you are 66 years old or older. Diabetes Have regular diabetes screenings. This checks your fasting blood sugar level. Have the screening done: Once every three years after age 101 if you are at a normal weight and have a low risk for diabetes. More often and at a younger age if you are overweight or have a high risk for diabetes. What should I know about preventing infection? Hepatitis B If you have a higher risk for hepatitis B, you should be screened for this virus. Talk with your health care provider to find out if you are at risk for hepatitis B infection. Hepatitis C Testing is recommended for: Everyone born from [birth year] through 1965. Anyone with known risk factors for hepatitis C. Sexually transmitted infections (STIs) Get screened for STIs, including gonorrhea and chlamydia, if: You are sexually active and are younger than 60 years of age. You are older than 60 years of age and your health care provider tells you that you  are at risk for this type of infection. Your sexual activity has changed since you were last screened, and you are at increased risk for chlamydia or gonorrhea. Ask your health care provider if you are at risk. Ask your health care provider about whether you are at high risk for HIV. Your health care provider may recommend a prescription medicine to help  prevent HIV infection. If you choose to take medicine to prevent HIV, you should first get tested for HIV. You should then be tested every 3 months for as long as you are taking the medicine. Pregnancy If you are about to stop having your period (premenopausal) and you may become pregnant, seek counseling before you get pregnant. Take 400 to 800 micrograms (mcg) of folic acid every day if you become pregnant. Ask for birth control (contraception) if you want to prevent pregnancy. Osteoporosis and menopause Osteoporosis is a disease in which the bones lose minerals and strength with aging. This can result in bone fractures. If you are 8 years old or older, or if you are at risk for osteoporosis and fractures, ask your health care provider if you should: Be screened for bone loss. Take a calcium or vitamin D supplement to lower your risk of fractures. Be given hormone replacement therapy (HRT) to treat symptoms of menopause. Follow these instructions at home: Alcohol use Do not drink alcohol if: Your health care provider tells you not to drink. You are pregnant, may be pregnant, or are planning to become pregnant. If you drink alcohol: Limit how much you have to: 0-1 drink a day. Know how much alcohol is in your drink. In the [place] one drink equals one 12 oz bottle of beer (355 mL), one 5 oz glass of wine (148 mL), or one 1 oz glass of hard liquor (44 mL). Lifestyle Do not use any products that contain nicotine or tobacco. These products include cigarettes, chewing tobacco, and vaping devices, such as e-cigarettes. If you need help quitting, ask your health care provider. Do not use street drugs. Do not share needles. Ask your health care provider for help if you need support or information about quitting drugs. General instructions Schedule regular health, dental, and eye exams. Stay current with your vaccines. Tell your health care provider if: You often feel depressed. You have ever  been abused or do not feel safe at home. Summary Adopting a healthy lifestyle and getting preventive care are important in promoting health and wellness. Follow your health care provider's instructions about healthy diet, exercising, and getting tested or screened for diseases. Follow your health care provider's instructions on monitoring your cholesterol and blood pressure. This information is not intended to replace advice given to you by your health care provider. Make sure you discuss any questions you have with your health care provider. Document Revised: 07/17/2020 Document Reviewed: 07/17/2020 Elsevier Patient Education  2022 [employer] Inc[employer]  Fat and Cholesterol Restricted Eating Plan Eating a diet that limits fat and cholesterol may help lower your risk for heart disease and other conditions. Your body needs fat and cholesterol for basic functions, but eating too much of these things can be harmful to your health. Your health care provider may order lab tests to check your blood fat (lipid) and cholesterol levels. This helps your health care provider understand your risk for certain conditions and whether you need to make diet changes. Work with your health care provider or dietitian to make an eating plan that is right for you. Your  plan includes: Limit your fat intake to ______% or less of your total calories a day. This is ______g of fat per day. Limit your saturated fat intake to ______% or less of your total calories a day. This is ______g of saturated fat per day. Limit the amount of cholesterol in your diet to less than _________mg a day. Eat ___________ g of fiber a day. What are tips for following this plan? General guidelines If you are overweight, work with your health care provider to lose weight safely. Losing just 5-10% of your body weight can improve your overall health and help prevent diseases such as diabetes and heart disease. Avoid: Foods with added sugar. Fried  foods. Foods that contain partially hydrogenated oils, including stick margarine, some tub margarines, cookies, crackers, and other baked goods. If you drink alcohol: Limit how much you have to: 0-1 drink a day for women who are not pregnant. 0-2 drinks a day for men. Know how much alcohol is in a drink. In the [place] one drink equals one 12 oz bottle of beer (355 mL), one 5 oz glass of wine (148 mL), or one 1 oz glass of hard liquor (44 mL). Reading food labels Check food labels for: Trans fats or partially hydrogenated oils. Avoid foods that contain these. High amounts of saturated fat. Choose foods that are low in saturated fat (less than 2 g). The amount of cholesterol in each serving. The amount of fiber in each serving. Choose foods with healthy fats, such as: Monounsaturated and polyunsaturated fats. These include olive and canola oil, flaxseeds, walnuts, almonds, and seeds. Omega-3 fats. These are found in foods such as salmon, mackerel, sardines, tuna, flaxseed oil, and ground flaxseeds. Choose grain products that have whole grains. Look for the word "whole" as the first word in the ingredient list. Cooking Cook foods using methods other than frying. Baking, boiling, grilling, and broiling are some healthy options. Eat more home-cooked food and less restaurant, buffet, and fast food. Avoid cooking using saturated fats. Animal sources of saturated fats include meats, butter, and cream. Plant sources of saturated fats include palm oil, palm kernel oil, and coconut oil. Meal planning  At meals, imagine dividing your plate into fourths: Fill one-half of your plate with vegetables, green salads, and fruit. Fill one-fourth of your plate with whole grains. Fill one-fourth of your plate with lean protein foods. Eat fish that is high in omega-3 fats at least two times a week. Eat more foods that contain fiber, such as whole grains, beans, apples, pears, berries, broccoli, carrots, peas,  and barley. These foods help promote healthy cholesterol levels in the blood. What foods should I eat? Fruits All fresh, canned (in natural juice), or frozen fruits. Vegetables Fresh or frozen vegetables (raw, steamed, roasted, or grilled). Green salads. Grains Whole grains, such as whole wheat or whole grain breads, crackers, cereals, and pasta. Unsweetened oatmeal, bulgur, barley, quinoa, or brown rice. Corn or whole wheat flour tortillas. Meats and other proteins Ground beef (85% or leaner), grass-fed beef, or beef trimmed of fat. Skinless chicken or [place]. Ground chicken or [place]. Pork trimmed of fat. All fish and seafood. Egg whites. Dried beans, peas, or lentils. Unsalted nuts or seeds. Unsalted canned beans. Natural nut butters without added sugar and oil. Dairy Low-fat or nonfat dairy products, such as skim or 1% milk, 2% or reduced-fat cheeses, low-fat and fat-free ricotta or cottage cheese, or plain low-fat and nonfat yogurt. Fats and oils Tub margarine without trans fats.  Light or reduced-fat mayonnaise and salad dressings. Avocado. Olive, canola, sesame, or safflower oils. The items listed above may not be a complete list of foods and beverages you can eat. Contact a dietitian for more information. What foods should I avoid? Fruits Canned fruit in heavy syrup. Fruit in cream or butter sauce. Fried fruit. Vegetables Vegetables cooked in cheese, cream, or butter sauce. Fried vegetables. Grains White bread. White pasta. White rice. Cornbread. Bagels, pastries, and croissants. Crackers and snack foods that contain trans fat and hydrogenated oils. Meats and other proteins Fatty cuts of meat. Ribs, chicken wings, bacon, sausage, bologna, salami, chitterlings, fatback, hot dogs, bratwurst, and packaged lunch meats. Liver and organ meats. Whole eggs and egg yolks. Chicken and [place] with skin. Fried meat. Dairy Whole or 2% milk, cream, half-and-half, and cream cheese. Whole milk  cheeses. Whole-fat or sweetened yogurt. Full-fat cheeses. Nondairy creamers and whipped toppings. Processed cheese, cheese spreads, and cheese curds. Fats and oils Butter, stick margarine, lard, shortening, ghee, or bacon fat. Coconut, palm kernel, and palm oils. Beverages Alcohol. Sugar-sweetened drinks such as sodas, lemonade, and fruit drinks. Sweets and desserts Corn syrup, sugars, honey, and molasses. Candy. Jam and jelly. Syrup. Sweetened cereals. Cookies, pies, cakes, donuts, muffins, and ice cream. The items listed above may not be a complete list of foods and beverages you should avoid. Contact a dietitian for more information. Summary Your body needs fat and cholesterol for basic functions. However, eating too much of these things can be harmful to your health. Work with your health care provider and dietitian to follow a diet that limits fat and cholesterol. Doing this may help lower your risk for heart disease and other conditions. Choose healthy fats, such as monounsaturated and polyunsaturated fats, and foods high in omega-3 fatty acids. Eat fiber-rich foods, such as whole grains, beans, peas, fruits, and vegetables. Limit or avoid alcohol, fried foods, and foods high in saturated fats, partially hydrogenated oils, and sugar. This information is not intended to replace advice given to you by your health care provider. Make sure you discuss any questions you have with your health care provider. Document Revised: 07/07/2020 Document Reviewed: 07/07/2020 Elsevier Patient Education  2022 [employer].

## 2021-02-15 MED ORDER — ROSUVASTATIN CALCIUM 5 MG PO TABS: 5.0000 mg | ORAL_TABLET | Freq: Every day | ORAL | 3 refills | Status: DC

## 2021-02-15 NOTE — Addendum Note (Signed)
Addended by: Margarita Mail on: 02/15/2021 08:38 AM   Modules accepted: Orders

## 2021-04-30 ENCOUNTER — Other Ambulatory Visit: Payer: Self-pay

## 2021-04-30 ENCOUNTER — Ambulatory Visit
Admission: RE | Admit: 2021-04-30 | Discharge: 2021-04-30 | Disposition: A | Discharge status: Home / Self Care | Payer: Federal, State, Local not specified - PPO | Source: Ambulatory Visit | Attending: Internal Medicine | Admitting: Internal Medicine

## 2021-04-30 DIAGNOSIS — Z1231 Encounter for screening mammogram for malignant neoplasm of breast: Secondary | ICD-10-CM | POA: Insufficient documentation

## 2021-12-04 ENCOUNTER — Encounter: Payer: Self-pay | Admitting: Family Medicine

## 2021-12-04 ENCOUNTER — Ambulatory Visit: Payer: Federal, State, Local not specified - PPO | Admitting: Family Medicine

## 2021-12-04 VITALS — BP 122/70 | HR 92 | Temp 97.5°F | Resp 16 | Ht 63.0 in | Wt 165.0 lb

## 2021-12-04 DIAGNOSIS — M25421 Effusion, right elbow: Secondary | ICD-10-CM

## 2021-12-04 MED ORDER — MELOXICAM 7.5 MG PO TABS: 7.5000 mg | ORAL_TABLET | Freq: Every day | ORAL | 0 refills | Status: DC

## 2021-12-04 NOTE — Patient Instructions (Signed)
You may call ortho yourself or go for a walk in appointment. Please call or go to their website for more [facility] [place], [place] [address] Phone: [phone] [email or web address]  I did put in a referral and you should hear soon from either our referral team or the ortho office for an appointment.   You can try mobic, ice/cryotherapy, and/or a elbow sleeve to help minimize the symptoms and you may see some swelling improve with these measures. The fluid can be drained and analysed.  We do not have the supplies in our office to do this procedure.   The ortho specialists will evaluate you and they may determine to do some x-rays.  We find that it is best to leave the imaging to them because they often get different kinds of images than what our outpatient imaging does and its best to have ortho determine that.  Elbow Bursitis  Elbow bursitis is the swelling of the fluid-filled sac (bursa) at the tip of the elbow. A bursa is like a cushion that protects the joint. If the bursa becomes irritated, it can fill with extra fluid and become swollen. What are the causes? Injury to the elbow. Leaning the elbow on a hard surface for a long time. Infection. Bone spurs. Certain conditions that cause swelling. Sometimes, the cause is not known. What are the signs or symptoms? The first sign of this condition is often swelling at the tip of the elbow. The swelling can grow to the size of a golf ball. Other symptoms include: Pain when bending or leaning on the elbow. Stiffness of the elbow. If the cause is infection, you may have: Redness, warmth, and tenderness. Pus coming from a cut near the elbow. How is this treated? Treatment depends on the cause. It may include: Medicines. Draining fluid from the bursa. Placing a bandage or pressure (compression) sleeve around the elbow. Wearing elbow pads. Surgery, if other  treatments do not help. Follow these instructions at home: Medicines Take over-the-counter and prescription medicines only as told by your doctor. If you were prescribed an antibiotic medicine, take it as told by your doctor. Do not stop taking it even if you start to feel better. Managing pain, stiffness, and swelling     If told, put ice on the elbow. To do this: Put ice in a plastic bag. Place a towel between your skin and the bag. Leave the ice on for 20 minutes, 2-3 times a day. Take off the ice if your skin turns bright red. This is very important. If you cannot feel pain, heat, or cold, you have a greater risk of damage to the area. If told, put heat on the affected area. Do this as often as told by your doctor. Use the heat source that your doctor recommends, such as a moist heat pack or a heating pad. Place a towel between your skin and the heat source. Leave the heat on for 20-30 minutes. Take off the heat if your skin turns bright red. This is very important. If you cannot feel pain, heat, or cold, you have a greater risk of getting burned. If your bursitis is caused by an injury, follow instructions from your doctor about: Resting your elbow. Wearing a bandage or sleeve. Wear elbow pads or elbow wraps as needed. These help cushion your elbow. General instructions Avoid any activities that cause elbow pain. Ask your doctor what activities are safe for you. Keep all  follow-up visits. Contact a doctor if: You have a fever. You have problems that do not get better with treatment. You have pain or swelling that: Gets worse. Goes away and then comes back. You have pus draining from your elbow. You have redness around the elbow area. Your elbow feels warm to the touch. Get help right away if: You have trouble moving your arm, hand, or fingers. Summary Elbow bursitis is the swelling of the fluid-filled sac (bursa) at the tip of the elbow. You may need to take medicine or put  ice on your elbow. Contact your doctor if your problems do not get better with treatment. Also, contact your doctor if your problems go away and then come back. This information is not intended to replace advice given to you by your health care provider. Make sure you discuss any questions you have with your health care provider. Document Revised: 02/20/2021 Document Reviewed: 02/20/2021 Elsevier Patient Education  2023 [employer].

## 2021-12-04 NOTE — Progress Notes (Signed)
Patient ID: Carrie Jackson, female    DOB: [birth year], 61 y.o.   MRN: [id number]  PCP: Delsa Grana, PA-C  Chief Complaint  Patient presents with   Cyst    On right elbow over 2 years has got bigger as time went by, no pain    Subjective:   Carrie Jackson is a 61 y.o. female, presents to clinic with CC of the following:  HPI  Pt presents for soft bulge on right elbow that has been there for at least 2 years It has gotten a little bigger or smaller at times, but never been painful or red, and no prior injury or contusion. She wanted to have it evaluated or drained to get rid of it. No prior xrays or trial of braces She previously used Naproxen for long period of time high dosing and had a rash, she tries to avoid meds, no anaphylactic rxn or other known allergy to NSAIDs - she thinks she had just taken so much at that time.   Patient Active Problem List   Diagnosis Date Noted   Arthritis of right knee 12/10/2017   Urge incontinence of urine 12/10/2017      Current Outpatient Medications:    meloxicam (MOBIC) 7.5 MG tablet, Take 1 tablet (7.5 mg total) by mouth daily., Disp: 30 tablet, Rfl: 0   rosuvastatin (CRESTOR) 5 MG tablet, Take 1 tablet (5 mg total) by mouth daily., Disp: 90 tablet, Rfl: 3   Allergies  Allergen Reactions   Naproxen Rash and Other (See Comments)    Rash and blisters to hands/body, no anaphylactic or airway involvement, she avoids all nsaids     Social History   Tobacco Use   Smoking status: Never   Smokeless tobacco: Never  Vaping Use   Vaping Use: Never used  Substance Use Topics   Alcohol use: Yes    Comment: occasional   Drug use: Never      Chart Review Today: I personally reviewed active problem list, medication list, allergies, family history, social history, health maintenance, notes from last encounter, lab results, imaging with the patient/caregiver today.   Review of Systems  Constitutional: Negative.   HENT:  Negative.    Eyes: Negative.   Respiratory: Negative.    Cardiovascular: Negative.   Gastrointestinal: Negative.   Endocrine: Negative.   Genitourinary: Negative.   Musculoskeletal: Negative.   Skin: Negative.   Allergic/Immunologic: Negative.   Neurological: Negative.   Hematological: Negative.   Psychiatric/Behavioral: Negative.    All other systems reviewed and are negative.      Objective:   Vitals:   12/04/21 1452  BP: 122/70  Pulse: 92  Resp: 16  Temp: (!) 97.5 F (36.4 C)  TempSrc: Oral  SpO2: 98%  Weight: 165 lb (74.8 kg)  Height: _0  (1.6 m)    Body mass index is 29.23 kg/m.  Physical Exam Vitals and nursing note reviewed.  Constitutional:      Appearance: She is well-developed.  HENT:     Head: Normocephalic and atraumatic.     Nose: Nose normal.  Eyes:     General:        Right eye: No discharge.        Left eye: No discharge.     Conjunctiva/sclera: Conjunctivae normal.  Neck:     Trachea: No tracheal deviation.  Cardiovascular:     Rate and Rhythm: Normal rate and regular rhythm.  Pulmonary:     Effort: Pulmonary effort is normal.  No respiratory distress.     Breath sounds: No stridor.  Musculoskeletal:     Right elbow: Effusion present. No swelling, deformity or lacerations. Normal range of motion. No tenderness.     Comments: Small soft effusion to right elbow, no erythema, non-tender, enlarged area roughly 2.5 x 2.5 x 1 cm   Skin:    General: Skin is warm and dry.     Findings: No rash.  Neurological:     Mental Status: She is alert.     Motor: No abnormal muscle tone.     Coordination: Coordination normal.  Psychiatric:        Behavior: Behavior normal.      Results for orders placed or performed in visit on 02/13/21  Lipid panel  Result Value Ref Range   Cholesterol 211 (H) <200 mg/dL   HDL 53 > OR = 50 mg/dL   Triglycerides 75 <150 mg/dL   LDL Cholesterol (Calc) 141 (H) mg/dL (calc)   Total CHOL/HDL Ratio 4.0 <5.0 (calc)    Non-HDL Cholesterol (Calc) 158 (H) <130 mg/dL (calc)  COMPLETE METABOLIC PANEL WITH GFR  Result Value Ref Range   Glucose, Bld 94 65 - 99 mg/dL   BUN 19 7 - 25 mg/dL   Creat 0.76 0.50 - 1.05 mg/dL   eGFR 90 > OR = 60 mL/min/1.59m   BUN/Creatinine Ratio NOT APPLICABLE 6 - 22 (calc)   Sodium 138 135 - 146 mmol/L   Potassium 4.3 3.5 - 5.3 mmol/L   Chloride 101 98 - 110 mmol/L   CO2 29 20 - 32 mmol/L   Calcium 10.1 8.6 - 10.4 mg/dL   Total Protein 7.3 6.1 - 8.1 g/dL   Albumin 4.8 3.6 - 5.1 g/dL   Globulin 2.5 1.9 - 3.7 g/dL (calc)   AG Ratio 1.9 1.0 - 2.5 (calc)   Total Bilirubin 0.5 0.2 - 1.2 mg/dL   Alkaline phosphatase (APISO) 82 37 - 153 U/L   AST 24 10 - 35 U/L   ALT 18 6 - 29 U/L  CBC w/Diff/Platelet  Result Value Ref Range   WBC 5.6 3.8 - 10.8 Thousand/uL   RBC 4.35 3.80 - 5.10 Million/uL   Hemoglobin 12.7 11.7 - 15.5 g/dL   HCT 38.5 35.0 - 45.0 %   MCV 88.5 80.0 - 100.0 fL   MCH 29.2 27.0 - 33.0 pg   MCHC 33.0 32.0 - 36.0 g/dL   RDW 12.9 11.0 - 15.0 %   Platelets 239 140 - 400 Thousand/uL   MPV 10.4 7.5 - 12.5 fL   Neutro Abs 3,539 1,500 - 7,800 cells/uL   Lymphs Abs 1,523 850 - 3,900 cells/uL   Absolute Monocytes 431 200 - 950 cells/uL   Eosinophils Absolute 78 15 - 500 cells/uL   Basophils Absolute 28 0 - 200 cells/uL   Neutrophils Relative % 63.2 %   Total Lymphocyte 27.2 %   Monocytes Relative 7.7 %   Eosinophils Relative 1.4 %   Basophils Relative 0.5 %       Assessment & Plan:     ICD-10-CM   1. Elbow swelling, right  M25.421 Ambulatory referral to Orthopedics    meloxicam (MOBIC) 7.5 MG tablet   possible effusion/bursitis, stable x 2y- pt referred to ortho for eval, unable to do procedure here with lack of compression dressing, defer xray to ortho     The area and joint did not appear inflamed or acutely infected, very stable sx x 2 yr, explained that pt  can try mobic, elbow sleeve/compression ice or heat therapy and see if the is any improvement   She can f/up with ortho for specialist eval/possibly aspiration. Reviewed signs/sx of infection that she should go urgently for eval - contact info/phone number/website and UC hours reviewed      Delsa Grana, PA-C 12/04/21 4:11 PM

## 2022-01-07 DIAGNOSIS — M7021 Olecranon bursitis, right elbow: Secondary | ICD-10-CM | POA: Diagnosis not present

## 2022-01-31 ENCOUNTER — Other Ambulatory Visit: Payer: Self-pay | Admitting: Internal Medicine

## 2022-01-31 DIAGNOSIS — E782 Mixed hyperlipidemia: Secondary | ICD-10-CM

## 2022-02-01 NOTE — Telephone Encounter (Signed)
Rx 02/15/21 #90 [id number]- too soon Requested Prescriptions  Pending Prescriptions Disp Refills   rosuvastatin (CRESTOR) 5 MG tablet [Pharmacy Med Name: ROSUVASTATIN CALCIUM 5 MG TAB] 90 tablet 3    Sig: TAKE 1 TABLET (5 MG TOTAL) BY MOUTH DAILY.     Cardiovascular:  Antilipid - Statins 2 Failed - 01/31/2022  2:32 PM      Failed - Lipid Panel in normal range within the last 12 months    Cholesterol  Date Value Ref Range Status  02/13/2021 211 (H) <200 mg/dL Final   LDL Cholesterol (Calc)  Date Value Ref Range Status  02/13/2021 141 (H) mg/dL (calc) Final    Comment:    Reference range: <100 . Desirable range <100 mg/dL for primary prevention;   <70 mg/dL for patients with CHD or diabetic patients  with > or = 2 CHD risk factors. Marland Kitchen LDL-C is now calculated using the Martin-Hopkins  calculation, which is a validated novel method providing  better accuracy than the Friedewald equation in the  estimation of LDL-C.  Horald Pollen et al. Lenox Ahr. 2440;102(72): 2061-2068  (http://education.[facility].com/faq/FAQ164)    HDL  Date Value Ref Range Status  02/13/2021 53 > OR = 50 mg/dL Final   Triglycerides  Date Value Ref Range Status  02/13/2021 75 <150 mg/dL Final         Passed - Cr in normal range and within 360 days    Creat  Date Value Ref Range Status  02/13/2021 0.76 0.50 - 1.05 mg/dL Final         Passed - Patient is not pregnant      Passed - Valid encounter within last 12 months    Recent Outpatient Visits           1 month ago Elbow swelling, right   [facility] Danelle Berry, PA-C   11 months ago Adult general medical exam   [facility] Margarita Mail, DO   1 year ago Cough   [facility] Alba Cory, MD   2 years ago Adult general medical exam   [facility] Danelle Berry, PA-C   3 years ago Well woman exam   [facility] Doren Custard, [place]       Future  Appointments             In 1 week Margarita Mail, DO [facility], [facility]

## 2022-02-14 ENCOUNTER — Other Ambulatory Visit (HOSPITAL_COMMUNITY)
Admission: RE | Admit: 2022-02-14 | Discharge: 2022-02-14 | Disposition: A | Discharge status: Home / Self Care | Payer: Federal, State, Local not specified - PPO | Source: Ambulatory Visit | Attending: Internal Medicine | Admitting: Internal Medicine

## 2022-02-14 ENCOUNTER — Encounter: Payer: Self-pay | Admitting: Internal Medicine

## 2022-02-14 ENCOUNTER — Ambulatory Visit (INDEPENDENT_AMBULATORY_CARE_PROVIDER_SITE_OTHER): Payer: Federal, State, Local not specified - PPO | Admitting: Internal Medicine

## 2022-02-14 VITALS — BP 116/64 | HR 85 | Temp 97.7°F | Resp 16 | Ht 63.0 in | Wt 160.1 lb

## 2022-02-14 DIAGNOSIS — Z124 Encounter for screening for malignant neoplasm of cervix: Secondary | ICD-10-CM | POA: Diagnosis not present

## 2022-02-14 DIAGNOSIS — Z0000 Encounter for general adult medical examination without abnormal findings: Secondary | ICD-10-CM

## 2022-02-14 DIAGNOSIS — E782 Mixed hyperlipidemia: Secondary | ICD-10-CM

## 2022-02-14 MED ORDER — ROSUVASTATIN CALCIUM 5 MG PO TABS: 5.0000 mg | ORAL_TABLET | Freq: Every day | ORAL | 3 refills | Status: DC

## 2022-02-14 NOTE — Progress Notes (Signed)
Name: Carrie Jackson   MRN: [id number]    DOB: [birth year]   Date:02/14/2022       Progress Note  Subjective  Chief Complaint  Chief Complaint  Patient presents with   Annual Exam    HPI  Patient presents for annual CPE.  HLD: -Medications: Crestor 5 mg new at last visit last year -Patient is compliant with above medications and reports no side effects.  -Last lipid panel: Lipid Panel     Component Value Date/Time   CHOL 211 (H) 02/13/2021 1200   TRIG 75 02/13/2021 1200   HDL 53 02/13/2021 1200   CHOLHDL 4.0 02/13/2021 1200   LDLCALC 141 (H) 02/13/2021 1200   The 10-year ASCVD risk score (Arnett DK, et al., 2019) is: 3.2%   Values used to calculate the score:     Age: 14 years     Sex: Female     Is Non-Hispanic African American: No     Diabetic: No     Tobacco smoker: No     Systolic Blood Pressure: 116 mmHg     Is BP treated: No     HDL Cholesterol: 53 mg/dL     Total Cholesterol: 211 mg/dL  Diet: well rounded  Exercise: regular - walk, weights, yoga  Last Eye Exam: Yes Last Dental Exam: Yes  Flowsheet Row Office Visit from [facility]/20/2020 in [facility]  AUDIT-C Score 1      Depression: Phq 9 is  negative    02/14/2022    9:31 AM 12/04/2021    2:53 PM 02/13/2021   11:23 AM 03/13/2020    9:14 AM 01/20/2020    9:55 AM  Depression screen PHQ 2/9  Decreased Interest 0 0 0 0 0  Down, Depressed, Hopeless 0 0 0 0 0  PHQ - 2 Score 0 0 0 0 0  Altered sleeping 0 0 0    Tired, decreased energy 0 0 0    Change in appetite 0 0 0    Feeling bad or failure about yourself  0 0 0    Trouble concentrating 0 0 0    Moving slowly or fidgety/restless 0 0 0    Suicidal thoughts 0 0 0    PHQ-9 Score 0 0 0    Difficult doing work/chores Not difficult at all Not difficult at all Not difficult at all     Hypertension: BP Readings from Last 3 Encounters:  02/14/22 116/64  12/04/21 122/70  02/13/21 114/66   Obesity: Wt Readings from Last 3 Encounters:   02/14/22 160 lb 1.6 oz (72.6 kg)  12/04/21 165 lb (74.8 kg)  02/13/21 169 lb 3.2 oz (76.7 kg)   BMI Readings from Last 3 Encounters:  02/14/22 28.36 kg/m  12/04/21 29.23 kg/m  02/13/21 29.97 kg/m     Vaccines:   HPV: n/a Tdap: 2019 Shingrix: 2022-2023 finished series  Pneumonia: Due in a few years Flu: UTD COVID-19: 2021   Hep C Screening: 2019 STD testing and prevention (HIV/chl/gon/syphilis): no concerns  Intimate partner violence: negative screen  Discussed importance of follow up if any post-menopausal bleeding: yes  Incontinence Symptoms: negative for symptoms   Breast cancer:  - Last Mammogram: 04/30/21   Osteoporosis Prevention : Discussed high calcium and vitamin D supplementation, weight bearing exercises Bone density :no   Cervical cancer screening: 2020, due   Skin cancer: Discussed monitoring for atypical lesions  Colorectal cancer: colonoscopy 2015, repeat in 10 years  Lung cancer:  Low Dose CT  Chest recommended if Age 76-80 years, 20 pack-year currently smoking OR have quit w/in 15years. Patient does not qualify for screen    Advanced Care Planning: A voluntary discussion about advance care planning including the explanation and discussion of advance directives.  Discussed health care proxy and Living will.  Patient does have a living will and power of attorney of health care   Lipids: Lab Results  Component Value Date   CHOL 211 (H) 02/13/2021   CHOL 225 (H) 01/20/2020   CHOL 240 (H) 01/18/2019   Lab Results  Component Value Date   HDL 53 02/13/2021   HDL 65 01/20/2020   HDL 60 01/18/2019   Lab Results  Component Value Date   LDLCALC 141 (H) 02/13/2021   LDLCALC 141 (H) 01/20/2020   LDLCALC 159 (H) 01/18/2019   Lab Results  Component Value Date   TRIG 75 02/13/2021   TRIG 84 01/20/2020   TRIG 97 01/18/2019   Lab Results  Component Value Date   CHOLHDL 4.0 02/13/2021   CHOLHDL 3.5 01/20/2020   CHOLHDL 4.0 01/18/2019   No results  found for: "LDLDIRECT"  Glucose: Glucose, Bld  Date Value Ref Range Status  02/13/2021 94 65 - 99 mg/dL Final    Comment:    .            Fasting reference interval .   01/20/2020 98 65 - 99 mg/dL Final    Comment:    .            Fasting reference interval .   01/18/2019 78 65 - 99 mg/dL Final    Comment:    .            Fasting reference interval .     Patient Active Problem List   Diagnosis Date Noted   Arthritis of right knee 12/10/2017   Urge incontinence of urine 12/10/2017    Past Surgical History:  Procedure Laterality Date   BUNIONECTOMY      Family History  Problem Relation Age of Onset   Hypothyroidism Mother    Cancer Father 56       Colon Cancer, passed at age 63   Breast cancer Neg Hx     Social History   Socioeconomic History   Marital status: Married    Spouse name: [name]   Number of children: 2   Years of education: Not on file   Highest education level: Not on file  Occupational History   Occupation: part-time seasonal    Comment: [employer]  Tobacco Use   Smoking status: Never   Smokeless tobacco: Never  Vaping Use   Vaping Use: Never used  Substance and Sexual Activity   Alcohol use: Yes    Comment: occasional   Drug use: Never   Sexual activity: Yes    Partners: Male  Other Topics Concern   Not on file  Social History Narrative   Not on file   Social Determinants of Health   Financial Resource Strain: Low Risk  (02/14/2022)   Overall Financial Resource Strain (CARDIA)    Difficulty of Paying Living Expenses: Not hard at all  Food Insecurity: No Food Insecurity (02/14/2022)   Hunger Vital Sign    Worried About Running Out of Food in the Last Year: Never true    Ran Out of Food in the Last Year: Never true  Transportation Needs: No Transportation Needs (02/14/2022)   PRAPARE - Administrator, Civil Service (Medical): No  Lack of Transportation (Non-Medical): No  Physical Activity: Sufficiently Active  (02/14/2022)   Exercise Vital Sign    Days of Exercise per Week: 6 days    Minutes of Exercise per Session: 50 min  Stress: Stress Concern Present (02/14/2022)   [employer] of Occupational Health - Occupational Stress Questionnaire    Feeling of Stress : To some extent  Social Connections: Moderately Integrated (02/14/2022)   Social Connection and Isolation Panel [NHANES]    Frequency of Communication with Friends and Family: More than three times a week    Frequency of Social Gatherings with Friends and Family: Once a week    Attends Religious Services: More than 4 times per year    Active Member of [employer] or Organizations: No    Attends Banker Meetings: Never    Marital Status: Married  Catering manager Violence: Not At Risk (02/14/2022)   Humiliation, Afraid, Rape, and Kick questionnaire    Fear of Current or Ex-Partner: No    Emotionally Abused: No    Physically Abused: No    Sexually Abused: No     Current Outpatient Medications:    rosuvastatin (CRESTOR) 5 MG tablet, Take 1 tablet (5 mg total) by mouth daily., Disp: 90 tablet, Rfl: 3   meloxicam (MOBIC) 7.5 MG tablet, Take 1 tablet (7.5 mg total) by mouth daily., Disp: 30 tablet, Rfl: 0  Allergies  Allergen Reactions   Naproxen Rash and Other (See Comments)    Rash and blisters to hands/body, no anaphylactic or airway involvement, she avoids all nsaids     Review of Systems  All other systems reviewed and are negative.     Objective  Vitals:   02/14/22 0926  BP: 116/64  Pulse: 85  Resp: 16  Temp: 97.7 F (36.5 C)  SpO2: 98%  Weight: 160 lb 1.6 oz (72.6 kg)  Height: 5\' 3"  (1.6 m)    Body mass index is 28.36 kg/m.  Physical Exam Exam conducted with a chaperone present.  Constitutional:      Appearance: Normal appearance.  HENT:     Head: Normocephalic and atraumatic.  Eyes:     Conjunctiva/sclera: Conjunctivae normal.  Cardiovascular:     Rate and Rhythm: Normal rate and regular  rhythm.  Pulmonary:     Effort: Pulmonary effort is normal.     Breath sounds: Normal breath sounds.  Chest:  Breasts:    Right: Normal.     Left: Normal.  Genitourinary:    Comments: External genitalia within normal limits.  Vaginal mucosa pink, moist, normal rugae.  Nonfriable cervix without lesions, no discharge or bleeding noted on speculum exam.   Lymphadenopathy:     Upper Body:     Right upper body: No supraclavicular, axillary or pectoral adenopathy.     Left upper body: No supraclavicular, axillary or pectoral adenopathy.  Skin:    General: Skin is warm and dry.  Neurological:     General: No focal deficit present.     Mental Status: She is alert. Mental status is at baseline.  Psychiatric:        Mood and Affect: Mood normal.        Behavior: Behavior normal.     No results found for this or any previous visit (from the past 2160 hour(s)).   Fall Risk:    02/14/2022    9:27 AM 12/04/2021    2:53 PM 02/13/2021   11:20 AM 03/13/2020    9:14 AM 01/20/2020  9:55 AM  Fall Risk   Falls in the past year? 0 0 0 0 0  Number falls in past yr: 0 0 0 0 0  Injury with Fall? 0 0 0 0 0  Risk for fall due to :  No Fall Risks     Follow up  Falls prevention discussed;Education provided   Falls evaluation completed     Functional Status Survey: Is the patient deaf or have difficulty hearing?: No Does the patient have difficulty seeing, even when wearing glasses/contacts?: No Does the patient have difficulty concentrating, remembering, or making decisions?: No Does the patient have difficulty walking or climbing stairs?: No Does the patient have difficulty dressing or bathing?: No Does the patient have difficulty doing errands alone such as visiting a doctor's office or shopping?: No   Assessment & Plan  1. Annual physical exam/Mixed hyperlipidemia: Due for annual labs. Check lipid panel today. Refill Crestor 5 mg. Follow up in 1 year.   - CBC w/Diff/Platelet - COMPLETE  METABOLIC PANEL WITH GFR - Lipid Profile - rosuvastatin (CRESTOR) 5 MG tablet; Take 1 tablet (5 mg total) by mouth daily.  Dispense: 90 tablet; Refill: 3  2. Cervical cancer screening: Pap today.   - Cytology - PAP   -USPSTF grade A and B recommendations reviewed with patient; age-appropriate recommendations, preventive care, screening tests, etc discussed and encouraged; healthy living encouraged; see AVS for patient education given to patient -Discussed importance of 150 minutes of physical activity weekly, eat two servings of fish weekly, eat one serving of tree nuts ( cashews, pistachios, pecans, almonds.Marland Kitchen) every other day, eat 6 servings of fruit/vegetables daily and drink plenty of water and avoid sweet beverages.   -Reviewed Health Maintenance: Yes.

## 2022-02-14 NOTE — Patient Instructions (Signed)
It was great seeing you today!  Plan discussed at today's visit: -Blood work ordered today, results will be uploaded to MyChart.  -Pap today -Medication refilled   Follow up in: 1 year or sooner as needed   Take care and let us know if you have any questions or concerns prior to your next visit.  Dr. Caralee Ates

## 2022-02-15 LAB — CBC WITH DIFFERENTIAL/PLATELET
Absolute Monocytes: 387 cells/uL (ref 200–950)
Basophils Absolute: 32 cells/uL (ref 0–200)
Basophils Relative: 0.7 %
Eosinophils Absolute: 59 cells/uL (ref 15–500)
Eosinophils Relative: 1.3 %
HCT: 35 % (ref 35.0–45.0)
Hemoglobin: 11.5 g/dL — ABNORMAL LOW (ref 11.7–15.5)
Lymphs Abs: 1395 cells/uL (ref 850–3900)
MCH: 28.8 pg (ref 27.0–33.0)
MCHC: 32.9 g/dL (ref 32.0–36.0)
MCV: 87.5 fL (ref 80.0–100.0)
MPV: 10.6 fL (ref 7.5–12.5)
Monocytes Relative: 8.6 %
Neutro Abs: 2628 cells/uL (ref 1500–7800)
Neutrophils Relative %: 58.4 %
Platelets: 255 10*3/uL (ref 140–400)
RBC: 4 10*6/uL (ref 3.80–5.10)
RDW: 13.5 % (ref 11.0–15.0)
Total Lymphocyte: 31 %
WBC: 4.5 10*3/uL (ref 3.8–10.8)

## 2022-02-15 LAB — COMPLETE METABOLIC PANEL WITH GFR
AG Ratio: 1.6 (calc) (ref 1.0–2.5)
ALT: 21 U/L (ref 6–29)
AST: 26 U/L (ref 10–35)
Albumin: 4.5 g/dL (ref 3.6–5.1)
Alkaline phosphatase (APISO): 76 U/L (ref 37–153)
BUN: 19 mg/dL (ref 7–25)
CO2: 28 mmol/L (ref 20–32)
Calcium: 9.7 mg/dL (ref 8.6–10.4)
Chloride: 103 mmol/L (ref 98–110)
Creat: 0.78 mg/dL (ref 0.50–1.05)
Globulin: 2.8 g/dL (calc) (ref 1.9–3.7)
Glucose, Bld: 85 mg/dL (ref 65–99)
Potassium: 4 mmol/L (ref 3.5–5.3)
Sodium: 140 mmol/L (ref 135–146)
Total Bilirubin: 0.3 mg/dL (ref 0.2–1.2)
Total Protein: 7.3 g/dL (ref 6.1–8.1)
eGFR: 86 mL/min/{1.73_m2} (ref >=60)

## 2022-02-15 LAB — LIPID PANEL
Cholesterol: 138 mg/dL (ref <200)
HDL: 62 mg/dL (ref >=50)
LDL Cholesterol (Calc): 62 mg/dL (calc)
Non-HDL Cholesterol (Calc): 76 mg/dL (calc) (ref <130)
Total CHOL/HDL Ratio: 2.2 (calc) (ref <5.0)
Triglycerides: 60 mg/dL (ref <150)

## 2022-02-19 LAB — CYTOLOGY - PAP
Comment: NEGATIVE
Diagnosis: NEGATIVE
High risk HPV: NEGATIVE

## 2022-04-02 ENCOUNTER — Other Ambulatory Visit: Payer: Self-pay | Admitting: Internal Medicine

## 2022-04-02 DIAGNOSIS — Z1231 Encounter for screening mammogram for malignant neoplasm of breast: Secondary | ICD-10-CM

## 2022-04-24 ENCOUNTER — Ambulatory Visit: Payer: Federal, State, Local not specified - PPO | Admitting: Dermatology

## 2022-04-24 VITALS — BP 121/72 | HR 72

## 2022-04-24 DIAGNOSIS — L821 Other seborrheic keratosis: Secondary | ICD-10-CM | POA: Diagnosis not present

## 2022-04-24 DIAGNOSIS — Q828 Other specified congenital malformations of skin: Secondary | ICD-10-CM | POA: Diagnosis not present

## 2022-04-24 DIAGNOSIS — L814 Other melanin hyperpigmentation: Secondary | ICD-10-CM | POA: Diagnosis not present

## 2022-04-24 NOTE — Progress Notes (Signed)
   New Patient Visit  Subjective  Carrie Jackson is a 62 y.o. female who presents for the following: check spots (L face, 65yrs, stays scaly and pink, irritating/Scalp, mos, raised spot, pt has picked at/Chest raised spot, mos, no symptoms).  The patient has spots, moles and lesions to be evaluated, some may be new or changing and the patient has concerns that these could be cancer.   The following portions of the chart were reviewed this encounter and updated as appropriate:       Review of Systems:  No other skin or systemic complaints except as noted in HPI or Assessment and Plan.  Objective  Well appearing patient in no apparent distress; mood and affect are within normal limits.  A focused examination was performed including face, scalp, chest. Relevant physical exam findings are noted in the Assessment and Plan.  L zygoma x 1 2.0 x 1.5cm light pink flesh patch with keratotic rim mild scale     Scalp, mid sternum Stuck on waxy paps with erythema    Assessment & Plan  Porokeratosis L zygoma x 1  Symptomatic, irritating, patient would like treated.   Destruction of lesion - L zygoma x 1  Destruction method: cryotherapy   Informed consent: discussed and consent obtained   Lesion destroyed using liquid nitrogen: Yes   Region frozen until ice ball extended beyond lesion: Yes   Outcome: patient tolerated procedure well with no complications   Post-procedure details: wound care instructions given   Additional details:  Prior to procedure, discussed risks of blister formation, small wound, skin dyspigmentation, or rare scar following cryotherapy. Recommend Vaseline ointment to treated areas while healing.   Seborrheic keratosis Scalp, mid sternum  Reassured benign age-related growth.  Recommend observation.  Discussed cryotherapy if spot(s) become irritated or inflamed.   Lentigines - Scattered tan macules - Due to sun exposure - Benign-appearing, observe -  Recommend daily broad spectrum sunscreen SPF 30+ to sun-exposed areas, reapply every 2 hours as needed. - Call for any changes   Return in about 2 months (around 06/23/2022) for recheck Porokeratosis.  I, Carrie Jackson, RMA, am acting as scribe for Carrie Patty, MD .  Documentation: I have reviewed the above documentation for accuracy and completeness, and I agree with the above.  Carrie Patty MD

## 2022-04-24 NOTE — Patient Instructions (Addendum)
Cryotherapy Aftercare  Wash gently with soap and water everyday.   Apply Vaseline and Band-Aid daily until healed.   Porokeratosis   Seborrheic Keratosis  What causes seborrheic keratoses? Seborrheic keratoses are harmless, common skin growths that first appear during adult life.  As time goes by, more growths appear.  Some people may develop a large number of them.  Seborrheic keratoses appear on both covered and uncovered body parts.  They are not caused by sunlight.  The tendency to develop seborrheic keratoses can be inherited.  They vary in color from skin-colored to gray, brown, or even black.  They can be either smooth or have a rough, warty surface.   Seborrheic keratoses are superficial and look as if they were stuck on the skin.  Under the microscope this type of keratosis looks like layers upon layers of skin.  That is why at times the top layer may seem to fall off, but the rest of the growth remains and re-grows.    Treatment Seborrheic keratoses do not need to be treated, but can easily be removed in the office.  Seborrheic keratoses often cause symptoms when they rub on clothing or jewelry.  Lesions can be in the way of shaving.  If they become inflamed, they can cause itching, soreness, or burning.  Removal of a seborrheic keratosis can be accomplished by freezing, burning, or surgery. If any spot bleeds, scabs, or grows rapidly, please return to have it checked, as these can be an indication of a skin cancer.   Due to recent changes in healthcare laws, you may see results of your pathology and/or laboratory studies on MyChart before the doctors have had a chance to review them. We understand that in some cases there may be results that are confusing or concerning to you. Please understand that not all results are received at the same time and often the doctors may need to interpret multiple results in order to provide you with the best plan of care or course of treatment.  Therefore, we ask that you please give [place] 2 business days to thoroughly review all your results before contacting the office for clarification. Should we see a critical lab result, you will be contacted sooner.   If You Need Anything After Your Visit  If you have any questions or concerns for your doctor, please call our main line at [phone] and press option 4 to reach your doctor's medical assistant. If no one answers, please leave a voicemail as directed and we will return your call as soon as possible. Messages left after 4 pm will be answered the following business day.   You may also send [place] a message via [place]. We typically respond to MyChart messages within 1-2 business days.  For prescription refills, please ask your pharmacy to contact our office. Our fax number is [phone].  If you have an urgent issue when the clinic is closed that cannot wait until the next business day, you can page your doctor at the number below.    Please note that while we do our best to be available for urgent issues outside of office hours, we are not available 24/7.   If you have an urgent issue and are unable to reach [place], you may choose to seek medical care at your doctor's office, retail clinic, urgent care center, or emergency room.  If you have a medical emergency, please immediately call 911 or go to the emergency department.  Pager Numbers  - Dr.  Nehemiah [id number] [phone]  - Dr. Laurence FerrariFF:[phone]  - Dr. Nicole Kindred: [phone]  In the event of inclement weather, please call our main line at [phone] for an update on the status of any delays or closures.  Dermatology Medication Tips: Please keep the boxes that topical medications come in in order to help keep track of the instructions about where and how to use these. Pharmacies typically print the medication instructions only on the boxes and not directly on the medication tubes.   If your medication is too expensive, please  contact our office at [phone] option 4 or send [place] a message through [place].   We are unable to tell what your co-pay for medications will be in advance as this is different depending on your insurance coverage. However, we may be able to find a substitute medication at lower cost or fill out paperwork to get insurance to cover a needed medication.   If a prior authorization is required to get your medication covered by your insurance company, please allow [place] 1-2 business days to complete this process.  Drug prices often vary depending on where the prescription is filled and some pharmacies may offer cheaper prices.  The website www.goodrx.com contains coupons for medications through different pharmacies. The prices here do not account for what the cost may be with help from insurance (it may be cheaper with your insurance), but the website can give you the price if you did not use any insurance.  - You can print the associated coupon and take it with your prescription to the pharmacy.  - You may also stop by our office during regular business hours and pick up a GoodRx coupon card.  - If you need your prescription sent electronically to a different pharmacy, notify our office through [facility] or by phone at [phone] option 4.     Si Usted Necesita Algo Despus de Su Visita  Tambin puede enviarnos un mensaje a travs de Pharmacist, community. Por lo general respondemos a los mensajes de MyChart en el transcurso de 1 a 2 das hbiles.  Para renovar recetas, por favor pida a su farmacia que se ponga en contacto con nuestra oficina. Harland Dingwall de fax es [place] [phone].  Si tiene un asunto urgente cuando la clnica est cerrada y que no puede esperar hasta el siguiente da hbil, puede llamar/localizar a su doctor(a) al nmero que aparece a continuacin.   Por favor, tenga en cuenta que aunque hacemos todo lo posible para estar disponibles para asuntos urgentes fuera del horario de  [place], no estamos disponibles las 24 horas del da, los 7 das de la [place].   Si tiene un problema urgente y no puede comunicarse con nosotros, puede optar por buscar atencin mdica  en el consultorio de su doctor(a), en una clnica privada, en un centro de atencin urgente o en una sala de emergencias.  Si tiene Engineering geologist, por favor llame inmediatamente al 911 o vaya a la sala de emergencias.  Nmeros de bper  - Dr. Nehemiah Massed: [phone]  - Dra. Moye: [phone]  - Dra. Nicole Kindred: [phone]  En caso de inclemencias del [place], por favor llame a Johnsie Kindred principal al [phone] para una actualizacin sobre el [place] de cualquier retraso o cierre.  Consejos para la medicacin en dermatologa: Por favor, guarde las cajas en las que vienen los medicamentos de uso tpico para ayudarle a seguir las instrucciones sobre dnde y cmo usarlos. Las farmacias generalmente imprimen las instrucciones del medicamento slo  en las cajas y no directamente en los tubos del medicamento.   Si su medicamento es muy caro, por favor, pngase en contacto con Zigmund Daniel llamando al [phone] y presione la opcin 4 o envenos un mensaje a travs de Pharmacist, community.   No podemos decirle cul ser su copago por los medicamentos por adelantado ya que esto es diferente dependiendo de la cobertura de su seguro. Sin embargo, es posible que podamos encontrar un medicamento sustituto a Electrical engineer un formulario para que el seguro cubra el medicamento que se considera necesario.   Si se requiere una autorizacin previa para que su compaa de seguros [place] su medicamento, por favor permtanos de 1 a 2 das hbiles para completar este proceso.  Los precios de los medicamentos varan con frecuencia dependiendo del Environmental consultant de dnde se surte la receta y alguna farmacias pueden ofrecer precios ms baratos.  El sitio web www.goodrx.com tiene cupones para medicamentos de Airline pilot. Los  precios aqu no tienen en cuenta lo que podra costar con la ayuda del seguro (puede ser ms barato con su seguro), pero el sitio web puede darle el precio si no utiliz Research scientist (physical sciences).  - Puede imprimir el cupn correspondiente y llevarlo con su receta a la farmacia.  - Tambin puede pasar por nuestra oficina durante el horario de atencin regular y Charity fundraiser una tarjeta de cupones de GoodRx.  - Si necesita que su receta se enve electrnicamente a una farmacia diferente, informe a nuestra oficina a travs de MyChart de [place] o por telfono llamando al [phone] y presione la opcin 4.

## 2022-05-01 ENCOUNTER — Ambulatory Visit
Admission: RE | Admit: 2022-05-01 | Discharge: 2022-05-01 | Disposition: A | Discharge status: Home / Self Care | Payer: Federal, State, Local not specified - PPO | Source: Ambulatory Visit | Attending: Internal Medicine | Admitting: Internal Medicine

## 2022-05-01 DIAGNOSIS — Z1231 Encounter for screening mammogram for malignant neoplasm of breast: Secondary | ICD-10-CM | POA: Diagnosis not present

## 2022-06-25 ENCOUNTER — Ambulatory Visit: Payer: Federal, State, Local not specified - PPO | Admitting: Dermatology

## 2022-06-25 ENCOUNTER — Encounter: Payer: Self-pay | Admitting: Dermatology

## 2022-06-25 VITALS — BP 127/76 | HR 62

## 2022-06-25 DIAGNOSIS — L820 Inflamed seborrheic keratosis: Secondary | ICD-10-CM

## 2022-06-25 DIAGNOSIS — L578 Other skin changes due to chronic exposure to nonionizing radiation: Secondary | ICD-10-CM | POA: Diagnosis not present

## 2022-06-25 DIAGNOSIS — Q828 Other specified congenital malformations of skin: Secondary | ICD-10-CM | POA: Diagnosis not present

## 2022-06-25 NOTE — Patient Instructions (Signed)
Cryotherapy Aftercare  Wash gently with soap and water everyday.   Apply Vaseline Jelly daily until healed.    Due to recent changes in healthcare laws, you may see results of your pathology and/or laboratory studies on MyChart before the doctors have had a chance to review them. We understand that in some cases there may be results that are confusing or concerning to you. Please understand that not all results are received at the same time and often the doctors may need to interpret multiple results in order to provide you with the best plan of care or course of treatment. Therefore, we ask that you please give [place] 2 business days to thoroughly review all your results before contacting the office for clarification. Should we see a critical lab result, you will be contacted sooner.   If You Need Anything After Your Visit  If you have any questions or concerns for your doctor, please call our main line at [phone] and press option 4 to reach your doctor's medical assistant. If no one answers, please leave a voicemail as directed and we will return your call as soon as possible. Messages left after 4 pm will be answered the following business day.   You may also send [place] a message via MyChart. We typically respond to MyChart messages within 1-2 business days.  For prescription refills, please ask your pharmacy to contact our office. Our fax number is [phone].  If you have an urgent issue when the clinic is closed that cannot wait until the next business day, you can page your doctor at the number below.    Please note that while we do our best to be available for urgent issues outside of office hours, we are not available 24/7.   If you have an urgent issue and are unable to reach [place], you may choose to seek medical care at your doctor's office, retail clinic, urgent care center, or emergency room.  If you have a medical emergency, please immediately call 911 or go to the emergency  department.  Pager Numbers  - Dr. Gwen Pounds: [phone]  - Dr. Neale Burly: [phone]  - Dr. Roseanne Reno: [phone]  In the event of inclement weather, please call our main line at [phone] for an update on the status of any delays or closures.  Dermatology Medication Tips: Please keep the boxes that topical medications come in in order to help keep track of the instructions about where and how to use these. Pharmacies typically print the medication instructions only on the boxes and not directly on the medication tubes.   If your medication is too expensive, please contact our office at [phone] option 4 or send [place] a message through MyChart.   We are unable to tell what your co-pay for medications will be in advance as this is different depending on your insurance coverage. However, we may be able to find a substitute medication at lower cost or fill out paperwork to get insurance to cover a needed medication.   If a prior authorization is required to get your medication covered by your insurance company, please allow [place] 1-2 business days to complete this process.  Drug prices often vary depending on where the prescription is filled and some pharmacies may offer cheaper prices.  The website www.goodrx.com contains coupons for medications through different pharmacies. The prices here do not account for what the cost may be with help from insurance (it may be cheaper with your insurance), but the website can give you  give you the price if you did not use any insurance.  - You can print the associated coupon and take it with your prescription to the pharmacy.  - You may also stop by our office during regular business hours and pick up a GoodRx coupon card.  - If you need your prescription sent electronically to a different pharmacy, notify our office through [place] MyChart or by phone at [phone] option 4.     Si Usted Necesita Algo Despus de Su Visita  Tambin puede  enviarnos un mensaje a travs de MyChart. Por lo general respondemos a los mensajes de MyChart en el transcurso de 1 a 2 das hbiles.  Para renovar recetas, por favor pida a su farmacia que se ponga en contacto con nuestra oficina. Nuestro nmero de fax es el [phone].  Si tiene un asunto urgente cuando la clnica est cerrada y que no puede esperar hasta el siguiente da hbil, puede llamar/localizar a su doctor(a) al nmero que aparece a continuacin.   Por favor, tenga en cuenta que aunque hacemos todo lo posible para estar disponibles para asuntos urgentes fuera del horario de oficina, no estamos disponibles las 24 horas del da, los 7 das de la semana.   Si tiene un problema urgente y no puede comunicarse con nosotros, puede optar por buscar atencin mdica  en el consultorio de su doctor(a), en una clnica privada, en un centro de atencin urgente o en una sala de emergencias.  Si tiene una emergencia mdica, por favor llame inmediatamente al 911 o vaya a la sala de emergencias.  Nmeros de bper  - Dr. Kowalski: [phone]  - Dra. Moye: [phone]  - Dra. Stewart: [phone]  En caso de inclemencias del tiempo, por favor llame a nuestra lnea principal al [phone] para una actualizacin sobre el estado de cualquier retraso o cierre.  Consejos para la medicacin en dermatologa: Por favor, guarde las cajas en las que vienen los medicamentos de uso tpico para ayudarle a seguir las instrucciones sobre dnde y cmo usarlos. Las farmacias generalmente imprimen las instrucciones del medicamento slo en las cajas y no directamente en los tubos del medicamento.   Si su medicamento es muy caro, por favor, pngase en contacto con nuestra oficina llamando al [phone] y presione la opcin 4 o envenos un mensaje a travs de MyChart.   No podemos decirle cul ser su copago por los medicamentos por adelantado ya que esto es diferente dependiendo de la cobertura de su seguro.  Sin embargo, es posible que podamos encontrar un medicamento sustituto a menor costo o llenar un formulario para que el seguro cubra el medicamento que se considera necesario.   Si se requiere una autorizacin previa para que su compaa de seguros cubra su medicamento, por favor permtanos de 1 a 2 das hbiles para completar este proceso.  Los precios de los medicamentos varan con frecuencia dependiendo del lugar de dnde se surte la receta y alguna farmacias pueden ofrecer precios ms baratos.  El sitio web www.goodrx.com tiene cupones para medicamentos de diferentes farmacias. Los precios aqu no tienen en cuenta lo que podra costar con la ayuda del seguro (puede ser ms barato con su seguro), pero el sitio web puede darle el precio si no utiliz ningn seguro.  - Puede imprimir el cupn correspondiente y llevarlo con su receta a la farmacia.  - Tambin puede pasar por nuestra oficina durante el horario de atencin regular y recoger una tarjeta de cupones de GoodRx.  -   Si necesita que su receta se enve electrnicamente a una farmacia diferente, informe a nuestra oficina a travs de MyChart de  o por telfono llamando al [phone] y presione la opcin 4.  

## 2022-06-25 NOTE — Progress Notes (Addendum)
   Follow-Up Visit   Subjective  Carrie Jackson is a 62 y.o. female who presents for the following: Follow up. Recheck spot at left zygoma. Tx with LN2 04/24/2022. Thinks may have residual area. Improved but not clear.  The patient has spots, moles and lesions to be evaluated, some may be new or changing and the patient has concerns that these could be cancer.    The following portions of the chart were reviewed this encounter and updated as appropriate: medications, allergies, medical history  Review of Systems:  No other skin or systemic complaints except as noted in HPI or Assessment and Plan.  Objective  Well appearing patient in no apparent distress; mood and affect are within normal limits.  A focused examination was performed of the following areas: Face  Relevant physical exam findings are noted in the Assessment and Plan.  Left Zygoma x1 Slight residual light pink flesh waxy patch with keratotic rim mild scale     Assessment & Plan   ACTINIC DAMAGE - chronic, secondary to cumulative UV radiation exposure/sun exposure over time - diffuse scaly erythematous macules with underlying dyspigmentation - Recommend daily broad spectrum sunscreen SPF 30+ to sun-exposed areas, reapply every 2 hours as needed.  - Recommend staying in the shade or wearing long sleeves, sun glasses (UVA+UVB protection) and wide brim hats (4-inch brim around the entire circumference of the hat). - Call for new or changing lesions.   Actinic skin damage  Inflamed seborrheic keratosis of left cheek Left Zygoma x1  Vrs irritated porokeratosis  Symptomatic, irritating, patient would like treated.   2nd treatment today.  If has not resolved in 6-8 weeks patient will call for Cholesterol 2% / Lovastatin 2% Cream from [facility].   Destruction of lesion - Left Zygoma x1  Destruction method: cryotherapy   Informed consent: discussed and consent obtained   Lesion destroyed using liquid  nitrogen: Yes   Region frozen until ice ball extended beyond lesion: Yes   Outcome: patient tolerated procedure well with no complications   Post-procedure details: wound care instructions given   Additional details:  Prior to procedure, discussed risks of blister formation, small wound, skin dyspigmentation, or rare scar following cryotherapy. Recommend Vaseline ointment to treated areas while healing.    Return if symptoms worsen or fail to improve.  I, Lawson Radar, CMA, am acting as scribe for Willeen Niece, MD.   Documentation: I have reviewed the above documentation for accuracy and completeness, and I agree with the above.  Willeen Niece, MD

## 2022-11-29 IMAGING — MG MM DIGITAL SCREENING BILAT W/ TOMO AND CAD
8 series · 8 of 24 positions shown · non-contrast
Comparison: Previous exam(s).

CLINICAL DATA: Screening.

EXAM:
DIGITAL SCREENING BILATERAL MAMMOGRAM WITH TOMOSYNTHESIS AND CAD
TECHNIQUE: Bilateral screening digital craniocaudal and mediolateral oblique
mammograms were obtained. Bilateral screening digital breast
tomosynthesis was performed. The images were evaluated with
computer-aided detection.

[R MLO synth-2D]
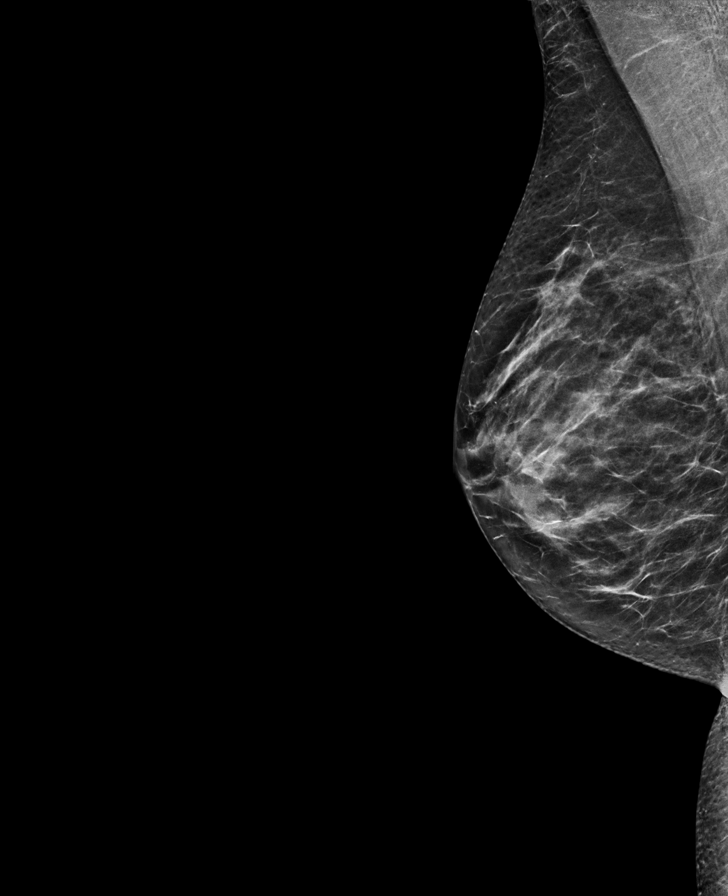

[L MLO synth-2D]
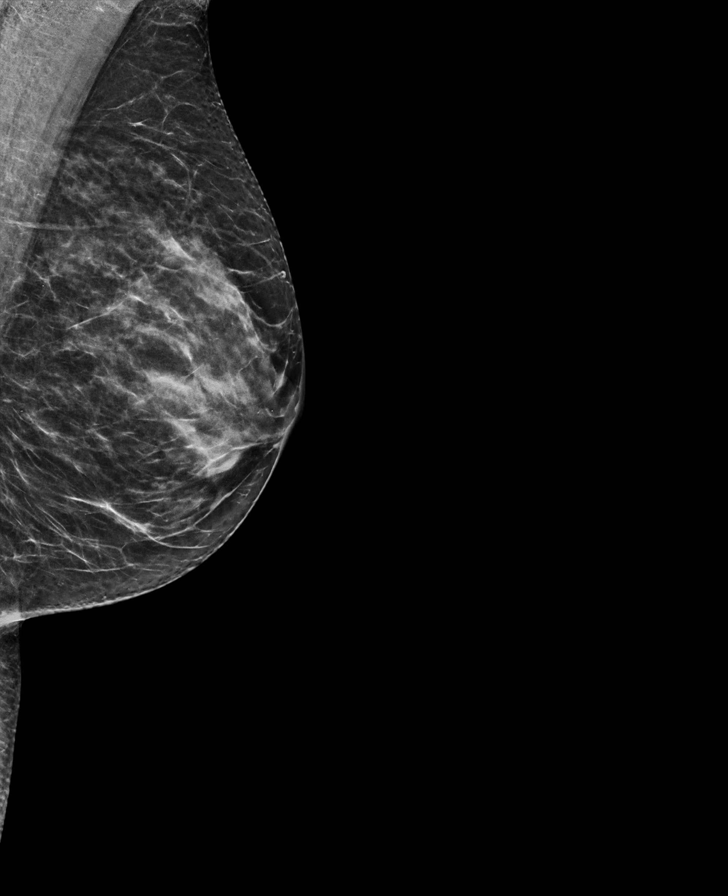

[R CC synth-2D]
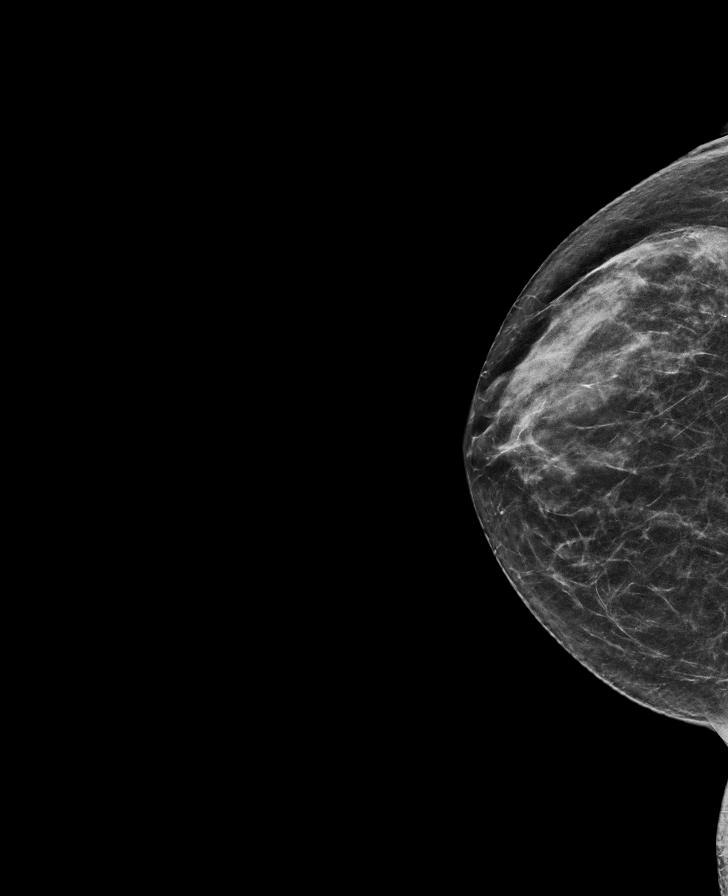

[L CC synth-2D]
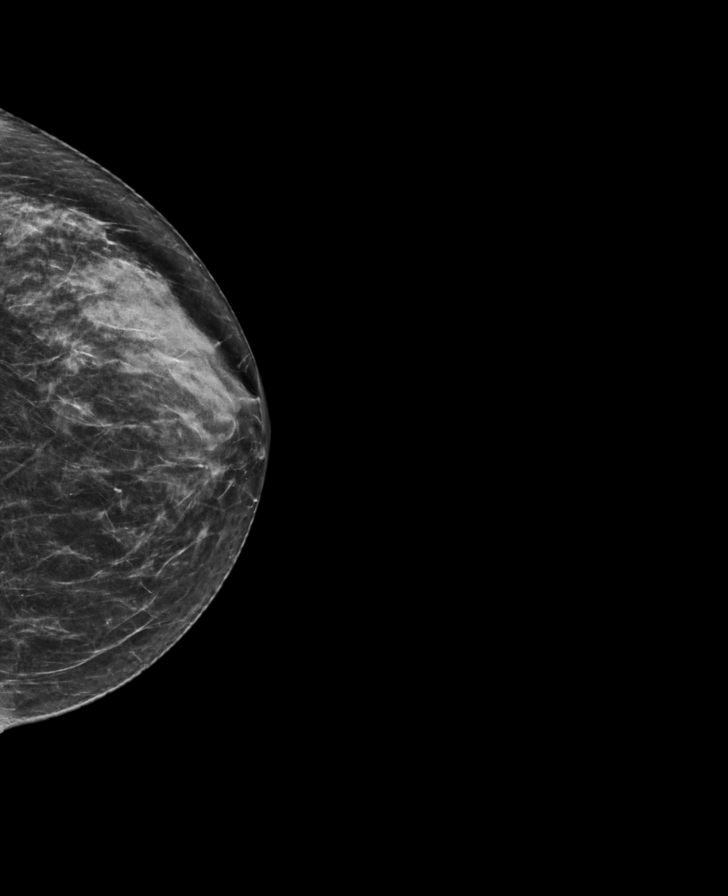

[L CC tomo · tomo slice 31/61.0]
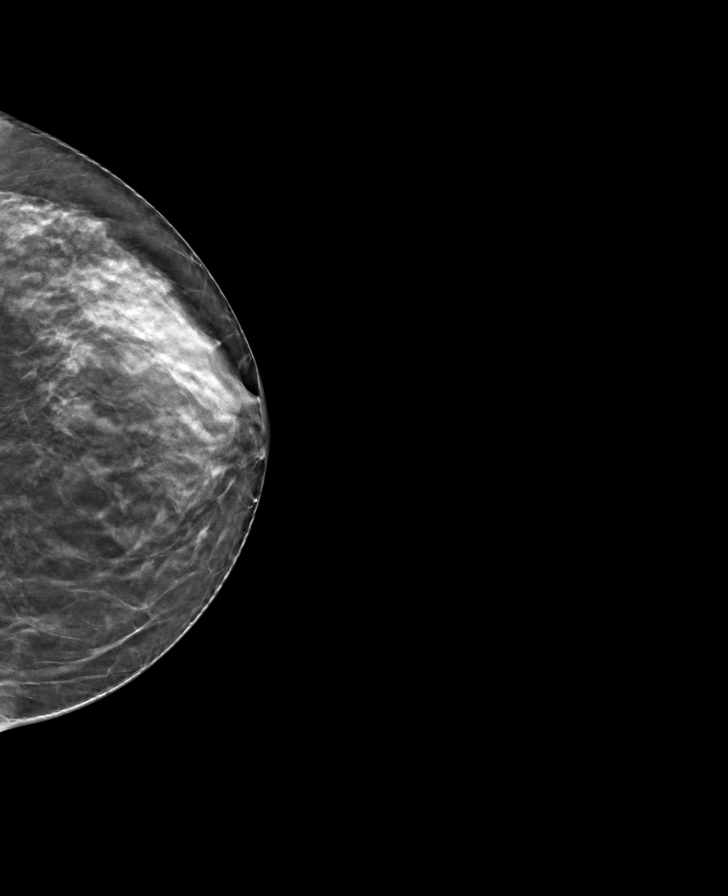

[R MLO tomo · tomo slice 31/62.0]
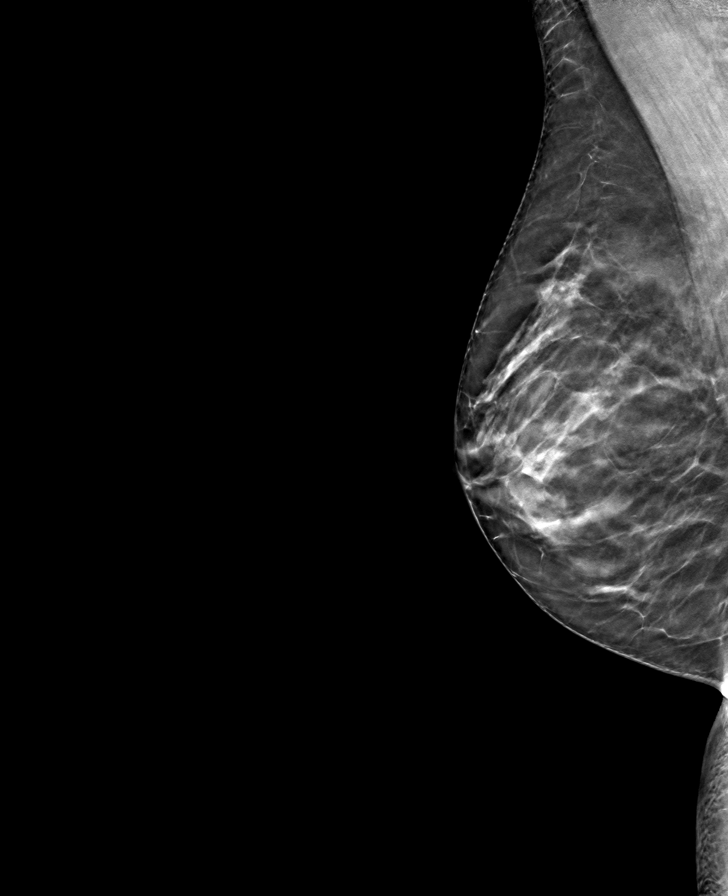

[R CC tomo · tomo slice 32/63.0]
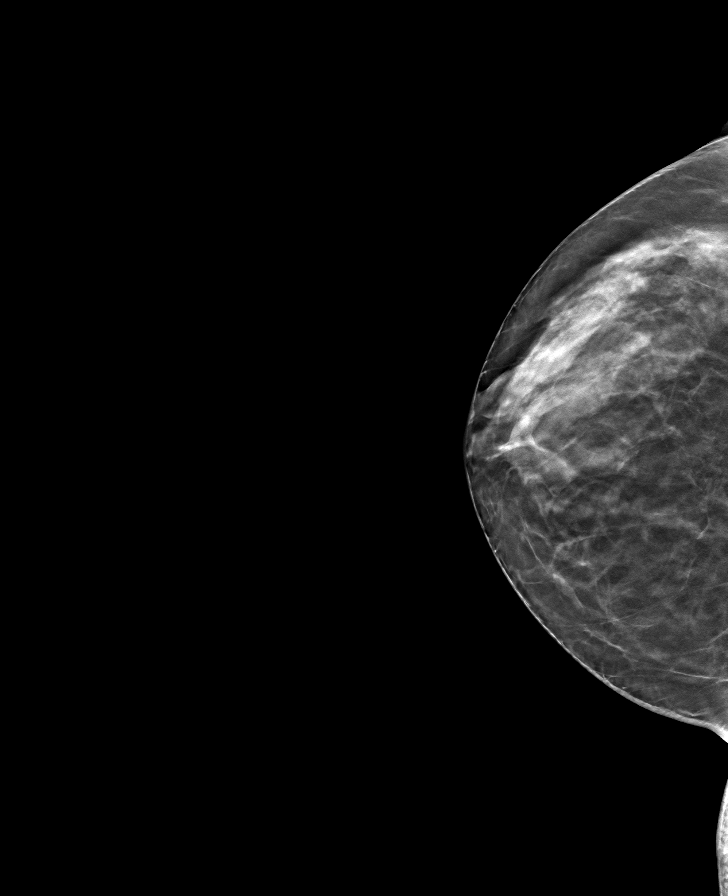

[L MLO tomo · tomo slice 32/63.0]
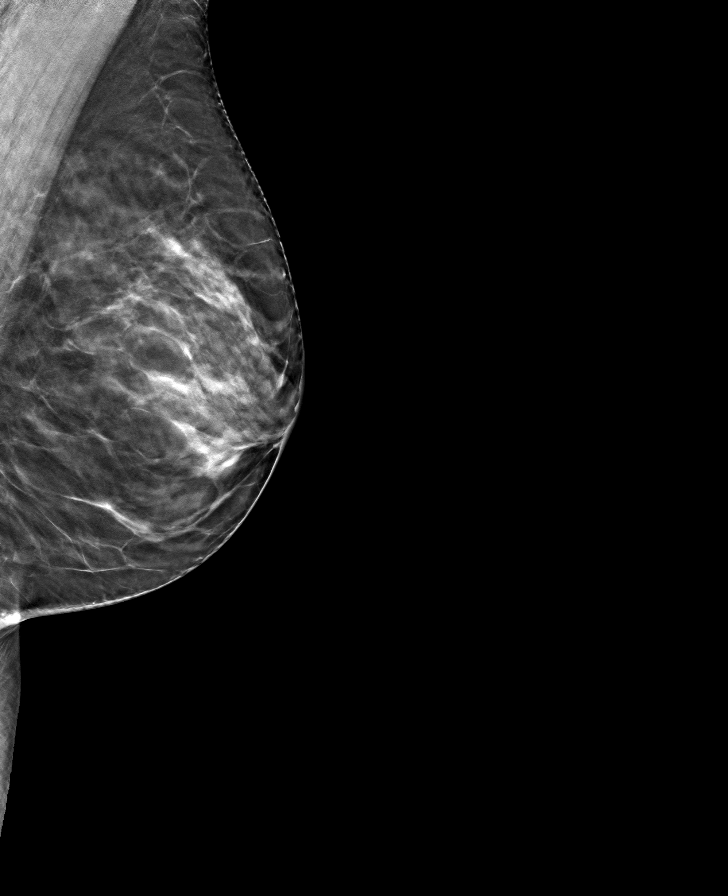

[8 of 24 positions shown; findings below may reference images not displayed]

ACR Breast Density Category c: The breast tissue is heterogeneously
dense, which may obscure small masses.
FINDINGS: There are no findings suspicious for malignancy.
IMPRESSION: No mammographic evidence of malignancy. A result letter of this
screening mammogram will be mailed directly to the patient.

RECOMMENDATION:
Screening mammogram in one year. (Code:[id number])

BI-RADS CATEGORY  1: Negative.

## 2022-12-11 ENCOUNTER — Other Ambulatory Visit: Payer: Self-pay

## 2022-12-11 DIAGNOSIS — E782 Mixed hyperlipidemia: Secondary | ICD-10-CM

## 2022-12-11 MED ORDER — ROSUVASTATIN CALCIUM 5 MG PO TABS: 5.0000 mg | ORAL_TABLET | Freq: Every day | ORAL | 0 refills | Status: DC

## 2023-02-01 ENCOUNTER — Other Ambulatory Visit: Payer: Self-pay | Admitting: Internal Medicine

## 2023-02-01 DIAGNOSIS — E782 Mixed hyperlipidemia: Secondary | ICD-10-CM

## 2023-02-03 NOTE — Telephone Encounter (Signed)
Requested Prescriptions  Refused Prescriptions Disp Refills   rosuvastatin (CRESTOR) 5 MG tablet [Pharmacy Med Name: Rosuvastatin Calcium 5 MG Oral Tablet] 90 tablet 0    Sig: Take 1 tablet by mouth once daily     Cardiovascular:  Antilipid - Statins 2 Failed - 02/01/2023 10:23 AM      Failed - Lipid Panel in normal range within the last 12 months    Cholesterol  Date Value Ref Range Status  02/14/2022 138 <200 mg/dL Final   LDL Cholesterol (Calc)  Date Value Ref Range Status  02/14/2022 62 mg/dL (calc) Final    Comment:    Reference range: <100 . Desirable range <100 mg/dL for primary prevention;   <70 mg/dL for patients with CHD or diabetic patients  with > or = 2 CHD risk factors. Marland Kitchen LDL-C is now calculated using the Martin-Hopkins  calculation, which is a validated novel method providing  better accuracy than the Friedewald equation in the  estimation of LDL-C.  Horald Pollen et al. Lenox Ahr. 8841;660(63): 2061-2068  (http://education.[facility].com/faq/FAQ164)    HDL  Date Value Ref Range Status  02/14/2022 62 > OR = 50 mg/dL Final   Triglycerides  Date Value Ref Range Status  02/14/2022 60 <150 mg/dL Final         Passed - Cr in normal range and within 360 days    Creat  Date Value Ref Range Status  02/14/2022 0.78 0.50 - 1.05 mg/dL Final         Passed - Patient is not pregnant      Passed - Valid encounter within last 12 months    Recent Outpatient Visits           11 months ago Annual physical exam   [facility] Margarita Mail, DO   1 year ago Elbow swelling, right   [facility] Danelle Berry, PA-C   1 year ago Adult general medical exam   [facility] Margarita Mail, DO   2 years ago Cough   [facility] Alba Cory, MD   3 years ago Adult general medical exam   [facility] Danelle Berry, [place]        Future Appointments             In 2 weeks Margarita Mail, DO [facility], [facility]

## 2023-02-17 NOTE — Progress Notes (Unsigned)
Name: Carrie Jackson   MRN: [id number]    DOB: [birth year]   Date:02/17/2023       Progress Note  Subjective  Chief Complaint  No chief complaint on file.   HPI  Patient presents for annual CPE.  Diet: *** Exercise: ***  Last Eye Exam: *** Last Dental Exam: ***  Flowsheet Row Office Visit from 01/20/2020 in [facility]  AUDIT-C Score 1      Depression: Phq 9 is  {Desc; negative/positive:13464}    02/14/2022    9:31 AM 12/04/2021    2:53 PM 02/13/2021   11:23 AM 03/13/2020    9:14 AM 01/20/2020    9:55 AM  Depression screen PHQ 2/9  Decreased Interest 0 0 0 0 0  Down, Depressed, Hopeless 0 0 0 0 0  PHQ - 2 Score 0 0 0 0 0  Altered sleeping 0 0 0    Tired, decreased energy 0 0 0    Change in appetite 0 0 0    Feeling bad or failure about yourself  0 0 0    Trouble concentrating 0 0 0    Moving slowly or fidgety/restless 0 0 0    Suicidal thoughts 0 0 0    PHQ-9 Score 0 0 0    Difficult doing work/chores Not difficult at all Not difficult at all Not difficult at all     Hypertension: BP Readings from Last 3 Encounters:  06/25/22 127/76  04/24/22 121/72  02/14/22 116/64   Obesity: Wt Readings from Last 3 Encounters:  02/14/22 160 lb 1.6 oz (72.6 kg)  12/04/21 165 lb (74.8 kg)  02/13/21 169 lb 3.2 oz (76.7 kg)   BMI Readings from Last 3 Encounters:  02/14/22 28.36 kg/m  12/04/21 29.23 kg/m  02/13/21 29.97 kg/m     Vaccines:   HPV:  Tdap: 12/10/2017 Shingrix:  Pneumonia:  Flu: 01/21/2023 COVID-19:01/21/23, 06/28/19, 06/07/19   Hep C Screening:  STD testing and prevention (HIV/chl/gon/syphilis):  Intimate partner violence: {Desc; negative/positive:13464} screen  Sexual History : Menstrual History/LMP/Abnormal Bleeding:  Discussed importance of follow up if any post-menopausal bleeding: {Response; yes/no/na:63}  Incontinence Symptoms: {Desc; negative/positive:13464} for symptoms   Breast cancer:  - Last Mammogram:  05/01/22 - BRCA gene screening:   Osteoporosis Prevention : Discussed high calcium and vitamin D supplementation, weight bearing exercises Bone density :yes 02/12/2018  Cervical cancer screening: 02/14/2022  Skin cancer: Discussed monitoring for atypical lesions  Colorectal cancer: 01/11/2014   Lung cancer:  Low Dose CT Chest recommended if Age 40-80 years, 20 pack-year currently smoking OR have quit w/in 15years. Patient {DOES NOT does:27190::"does not"} qualify for screen   ECG: ***  Advanced Care Planning: A voluntary discussion about advance care planning including the explanation and discussion of advance directives.  Discussed health care proxy and Living will, and the patient was able to identify a health care proxy as ***.  Patient {DOES_DOES XLK:44010} have a living will and power of attorney of health care   Lipids: Lab Results  Component Value Date   CHOL 138 02/14/2022   CHOL 211 (H) 02/13/2021   CHOL 225 (H) 01/20/2020   Lab Results  Component Value Date   HDL 62 02/14/2022   HDL 53 02/13/2021   HDL 65 01/20/2020   Lab Results  Component Value Date   LDLCALC 62 02/14/2022   LDLCALC 141 (H) 02/13/2021   LDLCALC 141 (H) 01/20/2020   Lab Results  Component Value Date   TRIG 60  02/14/2022   TRIG 75 02/13/2021   TRIG 84 01/20/2020   Lab Results  Component Value Date   CHOLHDL 2.2 02/14/2022   CHOLHDL 4.0 02/13/2021   CHOLHDL 3.5 01/20/2020   No results found for: "LDLDIRECT"  Glucose: Glucose, Bld  Date Value Ref Range Status  02/14/2022 85 65 - 99 mg/dL Final    Comment:    .            Fasting reference interval .   02/13/2021 94 65 - 99 mg/dL Final    Comment:    .            Fasting reference interval .   01/20/2020 98 65 - 99 mg/dL Final    Comment:    .            Fasting reference interval .     Patient Active Problem List   Diagnosis Date Noted   Arthritis of right knee 12/10/2017   Urge incontinence of urine 12/10/2017     Past Surgical History:  Procedure Laterality Date   BUNIONECTOMY      Family History  Problem Relation Age of Onset   Hypothyroidism Mother    Cancer Father 23       Colon Cancer, passed at age 16   Breast cancer Neg Hx     Social History   Socioeconomic History   Marital status: Married    Spouse name: [name]   Number of children: 2   Years of education: Not on file   Highest education level: Not on file  Occupational History   Occupation: part-time seasonal    Comment: [employer]  Tobacco Use   Smoking status: Never   Smokeless tobacco: Never  Vaping Use   Vaping status: Never Used  Substance and Sexual Activity   Alcohol use: Yes    Comment: occasional   Drug use: Never   Sexual activity: Yes    Partners: Male  Other Topics Concern   Not on file  Social History Narrative   Not on file   Social Determinants of Health   Financial Resource Strain: Low Risk  (02/14/2022)   Overall Financial Resource Strain (CARDIA)    Difficulty of Paying Living Expenses: Not hard at all  Food Insecurity: No Food Insecurity (02/14/2022)   Hunger Vital Sign    Worried About Running Out of Food in the Last Year: Never true    Ran Out of Food in the Last Year: Never true  Transportation Needs: No Transportation Needs (02/14/2022)   PRAPARE - Administrator, Civil Service (Medical): No    Lack of Transportation (Non-Medical): No  Physical Activity: Sufficiently Active (02/14/2022)   Exercise Vital Sign    Days of Exercise per Week: 6 days    Minutes of Exercise per Session: 50 min  Stress: Stress Concern Present (02/14/2022)   [employer] of Occupational Health - Occupational Stress Questionnaire    Feeling of Stress : To some extent  Social Connections: Moderately Integrated (02/14/2022)   Social Connection and Isolation Panel [NHANES]    Frequency of Communication with Friends and Family: More than three times a week    Frequency of Social Gatherings with Friends  and Family: Once a week    Attends Religious Services: More than 4 times per year    Active Member of [employer] or Organizations: No    Attends Banker Meetings: Never    Marital Status: Married  Catering manager Violence:  Not At Risk (02/14/2022)   Humiliation, Afraid, Rape, and Kick questionnaire    Fear of Current or Ex-Partner: No    Emotionally Abused: No    Physically Abused: No    Sexually Abused: No     Current Outpatient Medications:    meloxicam (MOBIC) 7.5 MG tablet, Take 1 tablet (7.5 mg total) by mouth daily., Disp: 30 tablet, Rfl: 0   rosuvastatin (CRESTOR) 5 MG tablet, Take 1 tablet (5 mg total) by mouth daily., Disp: 90 tablet, Rfl: 0  Allergies  Allergen Reactions   Naproxen Rash and Other (See Comments)    Rash and blisters to hands/body, no anaphylactic or airway involvement, she avoids all nsaids     ROS  ***  Objective  There were no vitals filed for this visit.  There is no height or weight on file to calculate BMI.  Physical Exam ***  No results found for this or any previous visit (from the past 2160 hour(s)).   Fall Risk:    02/14/2022    9:27 AM 12/04/2021    2:53 PM 02/13/2021   11:20 AM 03/13/2020    9:14 AM 01/20/2020    9:55 AM  Fall Risk   Falls in the past year? 0 0 0 0 0  Number falls in past yr: 0 0 0 0 0  Injury with Fall? 0 0 0 0 0  Risk for fall due to :  No Fall Risks     Follow up  Falls prevention discussed;Education provided   Falls evaluation completed   ***  Functional Status Survey:   ***  Assessment & Plan  There are no diagnoses linked to this encounter.  -USPSTF grade A and B recommendations reviewed with patient; age-appropriate recommendations, preventive care, screening tests, etc discussed and encouraged; healthy living encouraged; see AVS for patient education given to patient -Discussed importance of 150 minutes of physical activity weekly, eat two servings of fish weekly, eat one serving of  tree nuts ( cashews, pistachios, pecans, almonds.Marland Kitchen) every other day, eat 6 servings of fruit/vegetables daily and drink plenty of water and avoid sweet beverages.   -Reviewed Health Maintenance: {yes UU:725366}

## 2023-02-18 ENCOUNTER — Encounter: Payer: Self-pay | Admitting: Internal Medicine

## 2023-02-18 ENCOUNTER — Other Ambulatory Visit: Payer: Self-pay

## 2023-02-18 ENCOUNTER — Ambulatory Visit (INDEPENDENT_AMBULATORY_CARE_PROVIDER_SITE_OTHER): Payer: Federal, State, Local not specified - PPO | Admitting: Internal Medicine

## 2023-02-18 VITALS — BP 122/78 | HR 86 | Temp 98.1°F | Resp 16 | Ht 63.0 in | Wt 159.0 lb

## 2023-02-18 DIAGNOSIS — Z114 Encounter for screening for human immunodeficiency virus [HIV]: Secondary | ICD-10-CM | POA: Diagnosis not present

## 2023-02-18 DIAGNOSIS — E782 Mixed hyperlipidemia: Secondary | ICD-10-CM

## 2023-02-18 DIAGNOSIS — Z1231 Encounter for screening mammogram for malignant neoplasm of breast: Secondary | ICD-10-CM

## 2023-02-18 DIAGNOSIS — Z0000 Encounter for general adult medical examination without abnormal findings: Secondary | ICD-10-CM

## 2023-02-18 MED ORDER — ROSUVASTATIN CALCIUM 5 MG PO TABS
5.0000 mg | ORAL_TABLET | Freq: Every day | ORAL | 3 refills | Status: AC
Start: 2023-02-18 — End: ?

## 2023-02-18 NOTE — Patient Instructions (Signed)
It was great seeing you today!  Plan discussed at today's visit: -Blood work ordered today, results will be uploaded to MyChart. Please be fasting at least 8-12 hours and return for blood work. Lab is open every weekday from 8:30-11:30 and 1:30-3:30   Follow up in:  Take care and let us know if you have any questions or concerns prior to your next visit.  Dr. Caralee Ates

## 2023-03-03 DIAGNOSIS — Z114 Encounter for screening for human immunodeficiency virus [HIV]: Secondary | ICD-10-CM | POA: Diagnosis not present

## 2023-03-03 DIAGNOSIS — Z0000 Encounter for general adult medical examination without abnormal findings: Secondary | ICD-10-CM | POA: Diagnosis not present

## 2023-03-03 DIAGNOSIS — E782 Mixed hyperlipidemia: Secondary | ICD-10-CM | POA: Diagnosis not present

## 2023-03-04 LAB — CBC WITH DIFFERENTIAL/PLATELET
Absolute Lymphocytes: 1338 {cells}/uL (ref 850–3900)
Absolute Monocytes: 308 {cells}/uL (ref 200–950)
Basophils Absolute: 19 {cells}/uL (ref 0–200)
Basophils Relative: 0.5 %
Eosinophils Absolute: 87 {cells}/uL (ref 15–500)
Eosinophils Relative: 2.3 %
HCT: 39.8 % (ref 35.0–45.0)
Hemoglobin: 13.1 g/dL (ref 11.7–15.5)
MCH: 29.8 pg (ref 27.0–33.0)
MCHC: 32.9 g/dL (ref 32.0–36.0)
MCV: 90.5 fL (ref 80.0–100.0)
MPV: 10.2 fL (ref 7.5–12.5)
Monocytes Relative: 8.1 %
Neutro Abs: 2048 {cells}/uL (ref 1500–7800)
Neutrophils Relative %: 53.9 %
Platelets: 221 10*3/uL (ref 140–400)
RBC: 4.4 10*6/uL (ref 3.80–5.10)
RDW: 12.3 % (ref 11.0–15.0)
Total Lymphocyte: 35.2 %
WBC: 3.8 10*3/uL (ref 3.8–10.8)

## 2023-03-04 LAB — COMPLETE METABOLIC PANEL WITH GFR
AG Ratio: 1.9 (calc) (ref 1.0–2.5)
ALT: 26 U/L (ref 6–29)
AST: 23 U/L (ref 10–35)
Albumin: 4.7 g/dL (ref 3.6–5.1)
Alkaline phosphatase (APISO): 72 U/L (ref 37–153)
BUN: 18 mg/dL (ref 7–25)
CO2: 30 mmol/L (ref 20–32)
Calcium: 9.7 mg/dL (ref 8.6–10.4)
Chloride: 104 mmol/L (ref 98–110)
Creat: 0.7 mg/dL (ref 0.50–1.05)
Globulin: 2.5 g/dL (ref 1.9–3.7)
Glucose, Bld: 89 mg/dL (ref 65–99)
Potassium: 4.8 mmol/L (ref 3.5–5.3)
Sodium: 141 mmol/L (ref 135–146)
Total Bilirubin: 0.5 mg/dL (ref 0.2–1.2)
Total Protein: 7.2 g/dL (ref 6.1–8.1)
eGFR: 98 mL/min/{1.73_m2} (ref >=60)

## 2023-03-04 LAB — LIPID PANEL
Cholesterol: 161 mg/dL (ref <200)
HDL: 59 mg/dL (ref >=50)
LDL Cholesterol (Calc): 87 mg/dL
Non-HDL Cholesterol (Calc): 102 mg/dL (ref <130)
Total CHOL/HDL Ratio: 2.7 (calc) (ref <5.0)
Triglycerides: 60 mg/dL (ref <150)

## 2023-03-04 LAB — HIV ANTIBODY (ROUTINE TESTING W REFLEX): HIV 1&2 Ab, 4th Generation: NONREACTIVE

## 2023-05-06 ENCOUNTER — Ambulatory Visit
Admission: RE | Admit: 2023-05-06 | Discharge: 2023-05-06 | Disposition: A | Discharge status: Home / Self Care | Payer: Federal, State, Local not specified - PPO | Source: Ambulatory Visit | Attending: Internal Medicine | Admitting: Internal Medicine

## 2023-05-06 DIAGNOSIS — Z1231 Encounter for screening mammogram for malignant neoplasm of breast: Secondary | ICD-10-CM | POA: Diagnosis not present

## 2023-05-06 DIAGNOSIS — Z0000 Encounter for general adult medical examination without abnormal findings: Secondary | ICD-10-CM | POA: Diagnosis not present

## 2023-05-08 ENCOUNTER — Encounter: Payer: Self-pay | Admitting: Internal Medicine

## 2024-02-16 ENCOUNTER — Other Ambulatory Visit: Payer: Self-pay | Admitting: Medical Genetics

## 2024-02-19 ENCOUNTER — Ambulatory Visit: Admitting: Internal Medicine

## 2024-02-19 ENCOUNTER — Encounter: Payer: Self-pay | Admitting: Internal Medicine

## 2024-02-19 VITALS — BP 120/70 | HR 81 | Temp 97.9°F | Resp 16 | Ht 63.0 in | Wt 159.7 lb

## 2024-02-19 DIAGNOSIS — Z1231 Encounter for screening mammogram for malignant neoplasm of breast: Secondary | ICD-10-CM

## 2024-02-19 DIAGNOSIS — E559 Vitamin D deficiency, unspecified: Secondary | ICD-10-CM

## 2024-02-19 DIAGNOSIS — Z0001 Encounter for general adult medical examination with abnormal findings: Secondary | ICD-10-CM

## 2024-02-19 DIAGNOSIS — Z1211 Encounter for screening for malignant neoplasm of colon: Secondary | ICD-10-CM

## 2024-02-19 DIAGNOSIS — Z23 Encounter for immunization: Secondary | ICD-10-CM

## 2024-02-19 DIAGNOSIS — E782 Mixed hyperlipidemia: Secondary | ICD-10-CM

## 2024-02-19 DIAGNOSIS — Z0000 Encounter for general adult medical examination without abnormal findings: Secondary | ICD-10-CM | POA: Diagnosis not present

## 2024-02-19 MED ORDER — ROSUVASTATIN CALCIUM 5 MG PO TABS: 5.0000 mg | ORAL_TABLET | Freq: Every day | ORAL | 3 refills | Status: AC

## 2024-02-19 NOTE — Progress Notes (Signed)
 Name: Carrie Jackson   MRN: [id number]    DOB: [birth year]   Date:02/19/2024       Progress Note  Subjective  Chief Complaint  Chief Complaint  Patient presents with   Annual Exam    HPI  Patient presents for annual CPE.  Discussed the use of AI scribe software for clinical note transcription with the patient, who gave verbal consent to proceed.  History of Present Illness Carrie Jackson is a 63 year old female who presents for an annual physical exam.  She feels well and denies vaginal bleeding. Her last mammogram was in February 2025. Her colonoscopy in November 2015 was normal.  She takes low-dose Crestor  about five days per week, which controls her cholesterol. 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  Over the past year her resting heart rate has increased from about 55 to 65. She attributes this to less intense exercise. She walks daily with her husband and exercises every day but at a lower intensity.  She had a normal bone density scan in 2019. Her mother lost height at a similar age despite good bone density.  She is up to date on flu, tetanus, COVID, and shingles vaccines. She is aware of the updated pneumonia vaccine age recommendation to 69.   Diet: Regular Exercise: 7 days 60 minutes - walking  Last Eye Exam: completed Last Dental Exam: completed  Flowsheet Row Office Visit from 02/19/2024 in [facility]  AUDIT-C Score 0   Depression: Phq 9 is  negative    02/19/2024    9:21 AM 02/18/2023    8:30 AM 02/14/2022    9:31 AM 12/04/2021    2:53 PM 02/13/2021   11:23 AM  Depression screen PHQ 2/9  Decreased Interest 0 0 0 0 0  Down, Depressed, Hopeless 0 0 0 0 0  PHQ - 2 Score 0 0 0 0 0  Altered sleeping   0 0 0  Tired, decreased energy   0 0 0  Change in appetite   0 0 0  Feeling bad or failure about yourself    0 0 0  Trouble concentrating   0 0 0   Moving slowly or fidgety/restless   0 0 0  Suicidal thoughts   0 0 0  PHQ-9 Score   0  0  0   Difficult doing work/chores   Not difficult at all Not difficult at all Not difficult at all     Data saved with a previous flowsheet row definition   Hypertension: BP Readings from Last 3 Encounters:  02/19/24 120/70  02/18/23 122/78  06/25/22 127/76   Obesity: Wt Readings from Last 3 Encounters:  02/19/24 159 lb 11.2 oz (72.4 kg)  02/18/23 159 lb (72.1 kg)  02/14/22 160 lb 1.6 oz (72.6 kg)   BMI Readings from Last 3 Encounters:  02/19/24 28.29 kg/m  02/18/23 28.17 kg/m  02/14/22 28.36 kg/m     Vaccines: reviewed with the patient. Discussed Prevnar 20, patient is agreeable.   Hep C Screening: completed STD testing and prevention (HIV/chl/gon/syphilis): NA Intimate partner violence: negative screen  Sexual History :active Menstrual History/LMP/Abnormal Bleeding: NA Discussed importance of follow up if any post-menopausal bleeding: yes  Incontinence Symptoms: negative for symptoms   Breast cancer:  - Last Mammogram: 05/06/2023  Osteoporosis Prevention : Discussed high calcium  and vitamin D supplementation, weight bearing exercises Bone density :yes  Cervical cancer screening: up-to-date  Skin cancer: Discussed monitoring for atypical lesions  Colorectal cancer: 01/11/2014, repeat in 10 years, referral placed today Lung cancer:  Low Dose CT Chest recommended if Age 47-80 years, 20 pack-year currently smoking OR have quit w/in 15years. Patient does not qualify for screen    Advanced Care Planning: A voluntary discussion about advance care planning including the explanation and discussion of advance directives.  Discussed health care proxy and Living will, and the patient was able to identify a health care proxy.  Patient does have a living will and power of attorney of health care   Patient Active Problem List   Diagnosis Date Noted   Arthritis of right knee 12/10/2017    Urge incontinence of urine 12/10/2017    Past Surgical History:  Procedure Laterality Date   BUNIONECTOMY      Family History  Problem Relation Age of Onset   Hypothyroidism Mother    Cancer Father 38       Colon Cancer, passed at age 30   Breast cancer Neg Hx     Social History   Socioeconomic History   Marital status: Married    Spouse name: Redell   Number of children: 2   Years of education: Not on file   Highest education level: Bachelor's degree (e.g., BA, AB, BS)  Occupational History   Occupation: part-time seasonal    Comment: [employer]  Tobacco Use   Smoking status: Never   Smokeless tobacco: Never  Vaping Use   Vaping status: Never Used  Substance and Sexual Activity   Alcohol use: Yes    Comment: occasional   Drug use: Never   Sexual activity: Yes    Partners: Male  Other Topics Concern   Not on file  Social History Narrative   Not on file   Social Drivers of Health   Tobacco Use: Low Risk (02/19/2024)   Patient History    Smoking Tobacco Use: Never    Smokeless Tobacco Use: Never    Passive Exposure: Not on file  Financial Resource Strain: Low Risk (02/19/2024)   Overall Financial Resource Strain (CARDIA)    Difficulty of Paying Living Expenses: Not very hard  Food Insecurity: No Food Insecurity (02/19/2024)   Epic    Worried About Radiation Protection Practitioner of Food in the Last Year: Never true    Ran Out of Food in the Last Year: Never true  Transportation Needs: No Transportation Needs (02/19/2024)   Epic    Lack of Transportation (Medical): No    Lack of Transportation (Non-Medical): No  Physical Activity: Sufficiently Active (02/17/2023)   Exercise Vital Sign    Days of Exercise per Week: 6 days    Minutes of Exercise per Session: 50 min  Stress: No Stress Concern Present (02/19/2024)   [employer] of Occupational Health - Occupational Stress Questionnaire    Feeling of Stress: Only a little  Social Connections: Socially Integrated (02/19/2024)    Social Connection and Isolation Panel    Frequency of Communication with Friends and Family: Twice a week    Frequency of Social Gatherings with Friends and Family: Once a week    Attends Religious Services: More than 4 times per year    Active Member of [employer] or Organizations: Yes    Attends Banker Meetings: More than 4 times per year    Marital Status: Married  Catering Manager Violence: Not At Risk (02/19/2024)   Epic    Fear of Current or  Ex-Partner: No    Emotionally Abused: No    Physically Abused: No    Sexually Abused: No  Depression (PHQ2-9): Low Risk (02/19/2024)   Depression (PHQ2-9)    PHQ-2 Score: 0  Alcohol Screen: Low Risk (02/19/2024)   Alcohol Screen    Last Alcohol Screening Score (AUDIT): 0  Housing: Unknown (02/19/2024)   Epic    Unable to Pay for Housing in the Last Year: No    Number of Times Moved in the Last Year: Not on file    Homeless in the Last Year: No  Utilities: Not At Risk (02/18/2023)   [employer] Utilities    Threatened with loss of utilities: No  Health Literacy: Adequate Health Literacy (02/18/2023)   B1300 Health Literacy    Frequency of need for help with medical instructions: Never    Current Medications[1]  Allergies[2]   Review of Systems  All other systems reviewed and are negative.    Objective  Vitals:   02/19/24 0920  BP: 120/70  Pulse: 81  Resp: 16  Temp: 97.9 F (36.6 C)  TempSrc: Oral  SpO2: 99%  Weight: 159 lb 11.2 oz (72.4 kg)  Height: 5' 3 (1.6 m)    Body mass index is 28.29 kg/m.  Physical Exam Constitutional:      Appearance: Normal appearance.  HENT:     Head: Normocephalic and atraumatic.     Mouth/Throat:     Mouth: Mucous membranes are moist.     Pharynx: Oropharynx is clear.  Eyes:     Extraocular Movements: Extraocular movements intact.     Conjunctiva/sclera: Conjunctivae normal.     Pupils: Pupils are equal, round, and reactive to light.  Neck:     Comments: No  thyromegaly Cardiovascular:     Rate and Rhythm: Normal rate and regular rhythm.  Pulmonary:     Effort: Pulmonary effort is normal.     Breath sounds: Normal breath sounds.  Musculoskeletal:     Cervical back: No tenderness.     Right lower leg: No edema.     Left lower leg: No edema.  Lymphadenopathy:     Cervical: No cervical adenopathy.  Skin:    General: Skin is warm and dry.  Neurological:     General: No focal deficit present.     Mental Status: She is alert. Mental status is at baseline.  Psychiatric:        Mood and Affect: Mood normal.        Behavior: Behavior normal.     Last CBC Lab Results  Component Value Date   WBC 3.8 03/03/2023   HGB 13.1 03/03/2023   HCT 39.8 03/03/2023   MCV 90.5 03/03/2023   MCH 29.8 03/03/2023   RDW 12.3 03/03/2023   PLT 221 03/03/2023   Last metabolic panel Lab Results  Component Value Date   GLUCOSE 89 03/03/2023   NA 141 03/03/2023   K 4.8 03/03/2023   CL 104 03/03/2023   CO2 30 03/03/2023   BUN 18 03/03/2023   CREATININE 0.70 03/03/2023   EGFR 98 03/03/2023   CALCIUM  9.7 03/03/2023   PROT 7.2 03/03/2023   BILITOT 0.5 03/03/2023   AST 23 03/03/2023   ALT 26 03/03/2023   Last lipids Lab Results  Component Value Date   CHOL 161 03/03/2023   HDL 59 03/03/2023   LDLCALC 87 03/03/2023   TRIG 60 03/03/2023   CHOLHDL 2.7 03/03/2023   Last hemoglobin A1c No results found for: HGBA1C Last  thyroid functions Lab Results  Component Value Date   TSH 2.47 01/20/2020   Last vitamin D No results found for: 25OHVITD2, 25OHVITD3, VD25OH Last vitamin B12 and Folate No results found for: VITAMINB12, FOLATE    Assessment & Plan  Assessment & Plan Mixed hyperlipidemia Well-controlled with Crestor , effectively halving LDL levels. She takes Crestor  five days a week, especially during summer due to dietary challenges. - Continue Crestor  as prescribed. - Refilled Crestor  prescription for one year.  Vitamin D  deficiency Vitamin D levels need assessment to ensure adequate bone health. - Ordered vitamin D level test.  Annual Physical/General Health Maintenance Routine health maintenance is up to date with flu, tetanus, and COVID vaccines. Pneumonia vaccine is recommended due to updated age guidelines. Mammogram is scheduled for February 25th, 2026. Colon cancer screening referral is in place. Bone density scan is normal. - Administered pneumonia vaccine today. - Ordered labs including CBC, kidney, liver, electrolytes, and cholesterol. - Scheduled mammogram for February 25th, 2026. - Continue with colon cancer screening referral. - Ordered vitamin D level test.  - CBC w/Diff/Platelet - Comprehensive Metabolic Panel (CMET) - Lipid Profile - rosuvastatin  (CRESTOR ) 5 MG tablet; Take 1 tablet (5 mg total) by mouth daily.  Dispense: 90 tablet; Refill: 3 - Vitamin D (25 hydroxy) - MM 3D SCREENING MAMMOGRAM BILATERAL BREAST; Future - Ambulatory referral to Gastroenterology - Pneumococcal conjugate vaccine 20-valent (Prevnar 20)   -USPSTF grade A and B recommendations reviewed with patient; age-appropriate recommendations, preventive care, screening tests, etc discussed and encouraged; healthy living encouraged; see AVS for patient education given to patient -Discussed importance of 150 minutes of physical activity weekly, eat two servings of fish weekly, eat one serving of tree nuts ( cashews, pistachios, pecans, almonds.SABRA) every other day, eat 6 servings of fruit/vegetables daily and drink plenty of water and avoid sweet beverages.   -Reviewed Health Maintenance: Yes.       [1]  Current Outpatient Medications:    rosuvastatin  (CRESTOR ) 5 MG tablet, Take 1 tablet (5 mg total) by mouth daily., Disp: 90 tablet, Rfl: 3 [2]  Allergies Allergen Reactions   Naproxen Rash and Other (See Comments)    Rash and blisters to hands/body, no anaphylactic or airway involvement, she avoids all nsaids

## 2024-02-20 ENCOUNTER — Encounter: Payer: Self-pay | Admitting: Internal Medicine

## 2024-02-20 LAB — LIPID PANEL
Cholesterol: 157 mg/dL (ref <200)
HDL: 54 mg/dL (ref >=50)
LDL Cholesterol (Calc): 87 mg/dL
Non-HDL Cholesterol (Calc): 103 mg/dL (ref <130)
Total CHOL/HDL Ratio: 2.9 (calc) (ref <5.0)
Triglycerides: 75 mg/dL (ref <150)

## 2024-02-20 LAB — CBC WITH DIFFERENTIAL/PLATELET
Absolute Lymphocytes: 1549 {cells}/uL (ref 850–3900)
Absolute Monocytes: 361 {cells}/uL (ref 200–950)
Basophils Absolute: 22 {cells}/uL (ref 0–200)
Basophils Relative: 0.5 %
Eosinophils Absolute: 62 {cells}/uL (ref 15–500)
Eosinophils Relative: 1.4 %
HCT: 40.2 % (ref 35.9–46.0)
Hemoglobin: 13.2 g/dL (ref 11.7–15.5)
MCH: 29.7 pg (ref 27.0–33.0)
MCHC: 32.8 g/dL (ref 31.6–35.4)
MCV: 90.3 fL (ref 81.4–101.7)
MPV: 10.4 fL (ref 7.5–12.5)
Monocytes Relative: 8.2 %
Neutro Abs: 2407 {cells}/uL (ref 1500–7800)
Neutrophils Relative %: 54.7 %
Platelets: 239 Thousand/uL (ref 140–400)
RBC: 4.45 Million/uL (ref 3.80–5.10)
RDW: 12.6 % (ref 11.0–15.0)
Total Lymphocyte: 35.2 %
WBC: 4.4 Thousand/uL (ref 3.8–10.8)

## 2024-02-20 LAB — COMPREHENSIVE METABOLIC PANEL WITH GFR
AG Ratio: 1.9 (calc) (ref 1.0–2.5)
ALT: 25 U/L (ref 6–29)
AST: 23 U/L (ref 10–35)
Albumin: 4.8 g/dL (ref 3.6–5.1)
Alkaline phosphatase (APISO): 78 U/L (ref 37–153)
BUN: 21 mg/dL (ref 7–25)
CO2: 30 mmol/L (ref 20–32)
Calcium: 10.6 mg/dL — ABNORMAL HIGH (ref 8.6–10.4)
Chloride: 103 mmol/L (ref 98–110)
Creat: 0.71 mg/dL (ref 0.50–1.05)
Globulin: 2.5 g/dL (ref 1.9–3.7)
Glucose, Bld: 88 mg/dL (ref 65–99)
Potassium: 4.7 mmol/L (ref 3.5–5.3)
Sodium: 142 mmol/L (ref 135–146)
Total Bilirubin: 0.5 mg/dL (ref 0.2–1.2)
Total Protein: 7.3 g/dL (ref 6.1–8.1)
eGFR: 95 mL/min/1.73m2 (ref >=60)

## 2024-02-20 LAB — VITAMIN D 25 HYDROXY (VIT D DEFICIENCY, FRACTURES): Vit D, 25-Hydroxy: 41 ng/mL (ref 30–100)

## 2024-02-23 ENCOUNTER — Ambulatory Visit: Payer: Self-pay | Admitting: Internal Medicine

## 2024-02-24 ENCOUNTER — Telehealth: Payer: Self-pay

## 2024-02-24 ENCOUNTER — Other Ambulatory Visit: Payer: Self-pay

## 2024-02-24 DIAGNOSIS — Z800 Family history of malignant neoplasm of digestive organs: Secondary | ICD-10-CM

## 2024-02-24 DIAGNOSIS — Z1211 Encounter for screening for malignant neoplasm of colon: Secondary | ICD-10-CM

## 2024-02-24 MED ORDER — NA SULFATE-K SULFATE-MG SULF 17.5-3.13-1.6 GM/177ML PO SOLN: 1.0000 | Freq: Once | ORAL | 0 refills | Status: AC

## 2024-02-24 NOTE — Telephone Encounter (Signed)
 Gastroenterology Pre-Procedure Review  Request Date: 06/17/24 Requesting Physician: Dr. Jinny  PATIENT REVIEW QUESTIONS: The patient responded to the following health history questions as indicated:    1. Are you having any GI issues? no 2. Do you have a personal history of Polyps? no 3. Do you have a family history of Colon Cancer or Polyps? yes (father colon cancer) 4. Diabetes Mellitus? no 5. Joint replacements in the past 12 months?no 6. Major health problems in the past 3 months?no 7. Any artificial heart valves, MVP, or defibrillator?no    MEDICATIONS & ALLERGIES:    Patient reports the following regarding taking any anticoagulation/antiplatelet therapy:   Plavix, Coumadin, Eliquis, Xarelto, Lovenox, Pradaxa, Brilinta, or Effient? no Aspirin? no  Patient confirms/reports the following medications:  Current Outpatient Medications  Medication Sig Dispense Refill   rosuvastatin  (CRESTOR ) 5 MG tablet Take 1 tablet (5 mg total) by mouth daily. 90 tablet 3   No current facility-administered medications for this visit.    Patient confirms/reports the following allergies:  Allergies[1]  No orders of the defined types were placed in this encounter.   AUTHORIZATION INFORMATION Primary Insurance: 1D#: Group #:  Secondary Insurance: 1D#: Group #:  SCHEDULE INFORMATION: Date: 06/17/24 Time: Location: [facility]    [1]  Allergies Allergen Reactions   Naproxen Rash and Other (See Comments)    Rash and blisters to hands/body, no anaphylactic or airway involvement, she avoids all nsaids

## 2024-05-06 ENCOUNTER — Encounter

## 2024-06-17 ENCOUNTER — Ambulatory Visit: Admit: 2024-06-17 | Admitting: Gastroenterology

## 2024-08-03 ENCOUNTER — Ambulatory Visit: Admitting: Dermatology

## 2025-02-22 ENCOUNTER — Encounter: Admitting: Internal Medicine
